# Patient Record
Sex: Male | Born: 1945 | Race: Black or African American | Hispanic: No | Marital: Single | Smoking: Former smoker
Health system: Southern US, Community
[De-identification: ages and names within clinical notes are randomized; demographics above are authoritative.]

## PROBLEM LIST (undated history)

## (undated) DIAGNOSIS — E119 Type 2 diabetes mellitus without complications: Secondary | ICD-10-CM

## (undated) DIAGNOSIS — E785 Hyperlipidemia, unspecified: Secondary | ICD-10-CM

## (undated) DIAGNOSIS — I10 Essential (primary) hypertension: Secondary | ICD-10-CM

## (undated) DIAGNOSIS — C801 Malignant (primary) neoplasm, unspecified: Secondary | ICD-10-CM

## (undated) HISTORY — PX: PROSTATECTOMY: SHX69

---

## 2001-10-31 ENCOUNTER — Emergency Department (HOSPITAL_COMMUNITY): Admission: EM | Admit: 2001-10-31 | Discharge: 2001-11-01 | Payer: Self-pay | Admitting: *Deleted

## 2010-07-01 ENCOUNTER — Ambulatory Visit: Payer: Self-pay | Admitting: Internal Medicine

## 2010-07-01 ENCOUNTER — Inpatient Hospital Stay (HOSPITAL_COMMUNITY): Admission: EM | Admit: 2010-07-01 | Discharge: 2010-07-03 | Payer: Self-pay | Admitting: Cardiology

## 2010-07-01 ENCOUNTER — Ambulatory Visit: Payer: Self-pay | Admitting: Family Medicine

## 2010-07-02 ENCOUNTER — Encounter: Payer: Self-pay | Admitting: Family Medicine

## 2010-07-03 LAB — CONVERTED CEMR LAB
Hgb A1c MFr Bld: 6.5 %
Potassium: 3.7 meq/L
Sodium: 140 meq/L

## 2010-07-20 ENCOUNTER — Ambulatory Visit: Payer: Self-pay | Admitting: Family Medicine

## 2010-07-20 ENCOUNTER — Encounter: Payer: Self-pay | Admitting: Family Medicine

## 2010-07-20 DIAGNOSIS — N183 Chronic kidney disease, stage 3 (moderate): Secondary | ICD-10-CM

## 2010-07-20 DIAGNOSIS — I10 Essential (primary) hypertension: Secondary | ICD-10-CM

## 2010-07-20 DIAGNOSIS — Z8679 Personal history of other diseases of the circulatory system: Secondary | ICD-10-CM | POA: Insufficient documentation

## 2010-07-20 DIAGNOSIS — F17200 Nicotine dependence, unspecified, uncomplicated: Secondary | ICD-10-CM

## 2010-07-20 LAB — CONVERTED CEMR LAB
CO2: 25 meq/L (ref 19–32)
Calcium: 9.8 mg/dL (ref 8.4–10.5)
Glucose, Bld: 94 mg/dL (ref 70–99)
Sodium: 138 meq/L (ref 135–145)

## 2010-07-25 ENCOUNTER — Telehealth: Payer: Self-pay | Admitting: Family Medicine

## 2010-07-26 ENCOUNTER — Encounter: Payer: Self-pay | Admitting: Family Medicine

## 2010-07-26 ENCOUNTER — Ambulatory Visit: Payer: Self-pay | Admitting: Family Medicine

## 2010-08-01 ENCOUNTER — Telehealth: Payer: Self-pay | Admitting: *Deleted

## 2010-08-21 ENCOUNTER — Ambulatory Visit: Payer: Self-pay | Admitting: Family Medicine

## 2010-09-15 ENCOUNTER — Encounter: Payer: Self-pay | Admitting: Family Medicine

## 2010-09-20 ENCOUNTER — Ambulatory Visit: Payer: Self-pay | Admitting: Family Medicine

## 2010-09-20 ENCOUNTER — Encounter: Payer: Self-pay | Admitting: Family Medicine

## 2010-09-20 ENCOUNTER — Encounter: Payer: Self-pay | Admitting: *Deleted

## 2010-09-20 DIAGNOSIS — E1129 Type 2 diabetes mellitus with other diabetic kidney complication: Secondary | ICD-10-CM

## 2010-09-20 LAB — CONVERTED CEMR LAB
CO2: 24 meq/L (ref 19–32)
Chloride: 102 meq/L (ref 96–112)
Potassium: 5.1 meq/L (ref 3.5–5.3)
Sodium: 140 meq/L (ref 135–145)

## 2010-10-13 ENCOUNTER — Telehealth: Payer: Self-pay | Admitting: *Deleted

## 2010-10-16 ENCOUNTER — Telehealth: Payer: Self-pay | Admitting: *Deleted

## 2010-10-24 ENCOUNTER — Encounter (INDEPENDENT_AMBULATORY_CARE_PROVIDER_SITE_OTHER): Payer: Self-pay | Admitting: *Deleted

## 2010-11-02 ENCOUNTER — Telehealth: Payer: Self-pay | Admitting: Family Medicine

## 2010-12-26 NOTE — Assessment & Plan Note (Signed)
Summary: h/fup,tcb   Vital Signs:  Patient profile:   65 year old male Height:      69 inches Weight:      201 pounds BMI:     29.79 Temp:     97.9 degrees F oral Pulse rate:   65 / minute BP sitting:   176 / 97  (left arm) Cuff size:   regular  Vitals Entered By: Tessie Fass CMA (July 20, 2010 1:53 PM) CC: hospital f/u Is Patient Diabetic? Yes Pain Assessment Patient in pain? no        Primary Care Provider:  Clementeen Graham, MD  CC:  hospital f/u.  History of Present Illness: Hypertension Medications: HCTZ Headache: NA Pre/Syncope:No Chest Pain, Palpitation, Dyspnea:No Home BP: 135-179/85-97  Hyperlipidemia Medications:Pravastatin 40  Current LDL: 89 Chest Pain:No  CVA symptoms: Resolving. Hand stregenth and dexterity is improving on own. Feeling much better. Does not want to have another stroke. No new symptoms or signs.   Insurance: Advice worker. Pt lives in [place] near [place]. Ms Loleta Chance referred him to her equilivent in that county. He is waiting for apporval. Discussed that he may want to transfer PCP to that system for coverage.     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 1 By Mouth Daily 2)  Pravastatin Sodium 40 Mg Tabs (Pravastatin Sodium) 3)  Aspir-Low 81 Mg Tbec (Aspirin)  Allergies (verified): No Known Drug Allergies  Past History:  Family History: Last updated: 07/20/2010  Extensive hypertension, diabetes and  hyperlipidemia   history.  His sister died of a hemorrhagic stroke in her late 34s.  Mom  died of cancer in her later years.   Past Medical History: CVA 06/2010 HTN DM A1C 6.5 06/2010 CKD stage 3 (GFR 58) 06/2010  Past Surgical History: ECHO 06/2010: LVH EF 60-65% MRI/A 06/2010: Punctate infarcts occiital lobe, no carrotid stenosis.  Posterior neck surgery.   Family History:  Extensive hypertension, diabetes and hyperlipidemia   history.  His sister died of a hemorrhagic stroke in her late 68s.  Mom  died of cancer in her later years.   Social History: Lives alone but family lives next-door.  He is retired.   He uses tobacco in the form of dip 1 can a week and does not drink  alcohol and no drugs.Smoking Status:  current  Review of Systems  The patient denies anorexia, fever, weight loss, hoarseness, chest pain, syncope, dyspnea on exertion, peripheral edema, headaches, abdominal pain, muscle weakness, difficulty walking, depression, unusual weight change, enlarged lymph nodes, and testicular masses.    Physical Exam  General:  VS noted <elevated> Well appearing obese male in NAD Head:  Normocephalic and atraumatic without obvious abnormalities. No apparent alopecia or balding. Eyes:  No corneal or conjunctival inflammation noted. EOMI. Perrla.  Vision grossly normal. Nose:  External nasal examination shows no deformity or inflammation. Nasal mucosa are pink and moist without lesions or exudates. Mouth:  Oral mucosa and oropharynx without lesions or exudates.  Chest Wall:  No deformities, masses, tenderness or gynecomastia noted. Lungs:  Normal respiratory effort, chest expands symmetrically. Lungs are clear to auscultation, no crackles or wheezes. Heart:  Normal rate and regular rhythm. S1 and S2 normal without gallop, murmur, click, rub or other extra sounds. Abdomen:  Bowel sounds positive,abdomen soft and non-tender without masses, organomegaly or hernias noted. Extremities:  Non  edemetus BL LE, and DP/PT pulses 2+ BL LE Neurologic:  No cranial nerve deficits noted. Station and gait are normal. Plantar reflexes are down-going bilaterally. DTRs are symmetrical throughout. Sensory, motor and coordinative functions appear intact. Cervical Nodes:  No lymphadenopathy noted Axillary Nodes:  No palpable lymphadenopathy Psych:  Cognition and judgment appear intact. Alert and cooperative with normal attention span and concentration. No apparent delusions, illusions, hallucinations   Impression & Recommendations:  Problem # 1:  ESSENTIAL HYPERTENSION, BENIGN (ICD-401.1) Assessment New  His updated medication list for this problem includes:    Lisinopril-hydrochlorothiazide 20-25 Mg Tabs (Lisinopril-hydrochlorothiazide) .Marland Kitchen... 1 by mouth daily Doing well at home but room for tighter control.  Plan to start HCTZ/Lisinopril 25/20 combo pill and follow pressures in 1 month.  Will assess renal fxn as below.  Orders: Basic Met-[facility] [id number])  BP today: 176/97  Labs Reviewed: K+: 3.7 (07/03/2010) Creat: : 1.48 (07/03/2010)   HDL: 45 (07/03/2010)   LDL: 89 (07/03/2010)   TG: 66 (07/03/2010)  Problem # 2:  CHRONIC KIDNEY DISEASE STAGE III (MODERATE) (ICD-585.3) Assessment: New Will assess creatine today and in 1 month with startin ACEi therapy.   Labs Reviewed: Cr: 1.48 (07/03/2010)    Hgb: 14.7 (07/03/2010)     Problem # 3:  CEREBROVASCULAR ACCIDENT, HX OF (ICD-V12.50) Assessment: New Doing well will continue risk factor modification.   Problem # 4:  TOBACCO ABUSE (ICD-305.1) Assessment: New Uses dip. No lesions noted. Will continue to follow.   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 Will follow AIC q3 months.   His updated medication list for this problem includes:    Lisinopril-hydrochlorothiazide 20-25 Mg Tabs (Lisinopril-hydrochlorothiazide) .Marland Kitchen... 1 by mouth daily    Aspir-low 81 Mg Tbec  (Aspirin)  Complete Medication List: 1)  Lisinopril-hydrochlorothiazide 20-25 Mg Tabs (Lisinopril-hydrochlorothiazide) .Marland Kitchen.. 1 by mouth daily 2)  Pravastatin Sodium 40 Mg Tabs (Pravastatin sodium) 3)  Aspir-low 81 Mg Tbec (Aspirin)  Patient Instructions: 1)  Thank you for seeing me today. 2)  Get in touch with the social worker to get set up in [place]. A doctor there may be much cheaper.  3)  I switched the BP medication to a combopill. Start taking that. Save the hydrochlorothiazide.  4)  Please schedule a follow-up appointment in 1 month.  5)  If you have chest pain, difficulty breathing, fevers over 102 that does not get better with tylenol please call us or see a doctor.  Prescriptions: LISINOPRIL-HYDROCHLOROTHIAZIDE 20-25 MG TABS (LISINOPRIL-HYDROCHLOROTHIAZIDE) 1 by mouth daily  #30 x 6   Entered and Authorized by:   Clementeen Graham MD   Signed by:   Clementeen Graham MD on 07/20/2010   Method used:   Electronically to        [facility] [id number] (retail)       [address]       [place], [place]  [address]       Ph: [phone]       Fax: [phone]   RxID:   [id number]    -  Date:  07/03/2010    NA 140    K 3.7    CREAT 1.48    GLU 140    LDL 89    HDL 45    TG 66    HGB 14.7    PLTS 208    HgBA1c 6.5    TSH 2.33    Prevention & Chronic Care Immunizations   Influenza vaccine: Not documented   Influenza vaccine deferral: Not indicated  (07/20/2010)    Tetanus booster: Not documented   Td booster deferral: Deferred  (07/20/2010)    Pneumococcal vaccine: Not documented    H. zoster vaccine: Not documented   H. zoster vaccine deferral: Deferred  (07/20/2010)  Colorectal Screening   Hemoccult: Not documented   Hemoccult action/deferral: Deferred  (07/20/2010)    Colonoscopy: Not documented   Colonoscopy action/deferral: Deferred  (07/20/2010)  Other Screening   PSA: Not documented   PSA action/deferral: Discussion deferred  (07/20/2010)   Smoking  status: current  (07/20/2010)   Smoking cessation counseling: yes  (07/20/2010)  Diabetes Mellitus   HgbA1C: 6.5  (07/03/2010)    Eye exam: Not documented    Foot exam: Not documented   High risk foot: Not documented   Foot care education: Not documented    Urine microalbumin/creatinine ratio: Not documented    Diabetes flowsheet reviewed?: Yes   Progress toward A1C goal: At goal  Lipids   Total Cholesterol: Not documented   Lipid panel action/deferral: Not indicated   LDL: 89  (07/03/2010)   LDL Direct: Not documented   HDL: 45  (07/03/2010)   Triglycerides: Not documented  Hypertension   Last Blood Pressure: 176 / 97  (07/20/2010)   Serum creatinine: 1.48  (07/03/2010)   Serum potassium 3.7  (07/03/2010)    Hypertension flowsheet reviewed?: Yes   Progress toward BP goal: Deteriorated  Self-Management Support :   Personal Goals (by the next clinic visit) :     Personal A1C goal: 7  (07/20/2010)     Personal blood pressure goal: 140/90  (07/20/2010)     Personal LDL goal: 100  (07/20/2010)    Patient will work on the following items until the next clinic visit to reach self-care goals:     Medications and monitoring: take my medicines every day, check my blood pressure, bring all of my medications to every visit  (07/20/2010)    Diabetes self-management support: Not documented    Hypertension self-management support: Not documented

## 2010-12-26 NOTE — Miscellaneous (Signed)
Summary: medical record request    Rec'd medical record request to go to: Disability determination services date sent: 09/29/10 Marily Memos  October 24, 2010 11:06 AM

## 2010-12-26 NOTE — Miscellaneous (Signed)
Summary: Jury Duty Form  Patient dropped off Jury Duty form to be filled out.  Please fax when completed. Bradly Bienenstock  September 15, 2010 11:29 AM  [employer] M[employer] Duty form placed in Dr. Zollie Pee box. Terese Door  September 15, 2010 11:32 AM

## 2010-12-26 NOTE — Assessment & Plan Note (Signed)
Summary: F/U VISIT/[facility]   Vital Signs:  Patient profile:   65 year old male Height:      69 inches Weight:      194 pounds BMI:     28.75 Temp:     98.3 degrees F oral Pulse rate:   90 / minute BP sitting:   140 / 80  (left arm) Cuff size:   regular  Vitals Entered By: Jimmy Footman, CMA (September 20, 2010 1:47 PM) CC: BP f/u Is Patient Diabetic? Yes Did you bring your meter with you today? No Pain Assessment Patient in pain? no        Primary Care Provider:  Clementeen Graham, MD  CC:  BP f/u.  History of Present Illness: Mr Peter Levine presents to clinic to follow up his HTN, CKD and CVA:  Hypertension Medications: HCTZ, Amlodipine Headache: No Pre/Syncope:No Chest Pain, Palpitation, Dyspnea:No Home BP: Not measuring  CKD: Stable. Has stoped taking lisinopril as directed. Additionally is taking an Aspirin daily. No edema, or dyspnea. Has managed to get the [facility] (community access) card. Does not understand what CKD is. But does know what dialysis is and dose not want to get there.   CVA: Strength is improving in his right hand as well as dexterity.  Hand is feeling alsmost back to normal. Speech is also nearing normal. Only notes some lack of fine control with writing.  Does not think that OT will help much.   HLD: Taking statin as directed. Doing well. See above.    Habits & Providers  Alcohol-Tobacco-Diet     Alcohol drinks/day: 0     Tobacco Status: current     Tobacco Counseling: to quit use of tobacco products     Other Tobacco snuff     Other per week 1  Exercise-Depression-Behavior     Have you felt down or hopeless? no     Have you felt little pleasure in things? no     Depression Counseling: not indicated; screening negative for depression  Current Problems (verified): 1)  Tobacco Abuse  (ICD-305.1) 2)  Chronic Kidney Disease Stage Iii (MODERATE)  (ICD-585.3) 3)  Diab w/o Mention Comp Type Ii/uns Type Uncntrl  (ICD-250.02) 4)  Cerebrovascular Accident, Hx  of  (ICD-V12.50) 5)  Essential Hypertension, Benign  (ICD-401.1)  Current Medications (verified): 1)  Hydrochlorothiazide 25 Mg Tabs (Hydrochlorothiazide) .Marland Kitchen.. 1 By Mouth Daily 2)  Pravastatin Sodium 40 Mg Tabs (Pravastatin Sodium) 3)  Aspir-Low 81 Mg Tbec (Aspirin) 4)  Amlodipine Besylate 10 Mg Tabs (Amlodipine Besylate) .Marland Kitchen.. 1 By Mouth Daily  Allergies (verified): No Known Drug Allergies  Past History:  Past Medical History: Last updated: 07/20/2010 CVA 06/2010 HTN DM A1C 6.5 06/2010 CKD stage 3 (GFR 58) 06/2010  Past Surgical History: Last updated: 07/20/2010 ECHO 06/2010: LVH EF 60-65% MRI/A 06/2010: Punctate infarcts occiital lobe, no carrotid stenosis.  Posterior neck surgery.   Family History: Last updated: 07/20/2010  Extensive hypertension, diabetes and hyperlipidemia   history.  His sister died of a hemorrhagic stroke in her late 76s.  Mom  died of cancer in her later years.   Social History: Last updated: 07/20/2010 Lives alone but family lives next-door.  He is retired.   He uses tobacco in the form of dip 1 can a week and does not drink  alcohol and no drugs.  Risk Factors: Smoking Status: current (09/20/2010)  Social History: Reviewed history from 07/20/2010 and no changes required. Lives alone but family lives next-door.  He is retired.  He uses tobacco in the form of dip 1 can a week and does not drink  alcohol and no drugs.  Review of Systems  The patient denies anorexia, fever, weight loss, hoarseness, chest pain, syncope, dyspnea on exertion, prolonged cough, headaches, abdominal pain, melena, severe indigestion/heartburn, muscle weakness, transient blindness, difficulty walking, abnormal bleeding, and enlarged lymph nodes.    Physical Exam  General:  VS noted.  Well male in NAD Head:  Normocephalic and atraumatic without obvious abnormalities. No apparent alopecia or balding. Eyes:  No corneal or conjunctival inflammation noted. EOMI. Perrla.   Vision grossly normal. Ears:  no external deformities.   Nose:  no external deformity.   Mouth:  Oral mucosa and oropharynx without lesions or exudates.  Neck:   No JVD noted. Lungs:  Normal respiratory effort, chest expands symmetrically. Lungs are clear to auscultation, no crackles or wheezes. Heart:  Normal rate and regular rhythm. S1 and S2 normal without gallop, murmur, click, rub or other extra sounds. Abdomen:  Bowel sounds positive,abdomen soft and non-tender without masses, organomegaly or hernias noted. Extremities:  Non edemetus BL LE. Well perfused.  Neurologic:  alert & oriented X3, cranial nerves II-XII intact, strength normal in all extremities, sensation intact to light touch, and gait normal.   Cervical Nodes:  No lymphadenopathy noted Axillary Nodes:  No palpable lymphadenopathy   Impression & Recommendations:  Problem # 1:  ESSENTIAL HYPERTENSION, BENIGN (ICD-401.1) Assessment Improved BP at goal. Idealy would like 130s/70s.  Pt may need 3rd antihypertensive.  May consider restarting a low dose ACEi. See below.  Will follow.  His updated medication list for this problem includes:    Hydrochlorothiazide 25 Mg Tabs (Hydrochlorothiazide) .Marland Kitchen... 1 by mouth daily    Amlodipine Besylate 10 Mg Tabs (Amlodipine besylate) .Marland Kitchen... 1 by mouth daily  Orders: Nephrology Referral (Nephro) [facility]- Est  Level 4 ([address]214)  BP today: 140/80 Prior BP: 160/82 (08/21/2010)  Labs Reviewed: K+: 5.3 (07/26/2010) Creat: : 2.11 (07/26/2010)   HDL: 45 (07/03/2010)   LDL: 89 (07/03/2010)   TG: 66 (07/03/2010)  Problem # 2:  CHRONIC KIDNEY DISEASE STAGE III (MODERATE) (ICD-585.3) Assessment: Unchanged CKD III.  BP controlled. On statin and aspirin.  Will refer patient to nephrology as has stage III CKD and HTN.  Will follow.   Orders: Basic Met-[facility] ([id number]) Nephrology Referral (Nephro) [facility]- Est  Level 4 (56213)  Problem # 3:  TOBACCO ABUSE (ICD-305.1) Assessment: Unchanged  On 1  can of dip per week. Not ready to quit yet.  Discussed ways to quit.  Orders: [facility]- Est  Level 4 (08657)  Problem # 4:  DIAB W/RENAL MANIFESTS TYPE II/UNS NOT UNCNTRL (ICD-250.40) Assessment: Unchanged  A1C at goal. Will follow.  Alpha Gula will get patient to diabetes education.  His updated medication list for this problem includes:    Aspir-low 81 Mg Tbec (Aspirin)  Labs Reviewed: Creat: 2.11 (07/26/2010)    Reviewed HgBA1c results: 6.5 (07/03/2010)  Orders: [facility]- Est  Level 4 (84696)  Problem # 5:  CEREBROVASCULAR ACCIDENT, HX OF (ICD-V12.50) Assessment: Improved Doing well today. Recovering well.  Will continue ASA and follow.   Complete Medication List: 1)  Hydrochlorothiazide 25 Mg Tabs (Hydrochlorothiazide) .Marland Kitchen.. 1 by mouth daily 2)  Pravastatin Sodium 40 Mg Tabs (Pravastatin sodium) 3)  Aspir-low 81 Mg Tbec (Aspirin) 4)  Amlodipine Besylate 10 Mg Tabs (Amlodipine besylate) .Marland Kitchen.. 1 by mouth daily  Other Orders: Influenza Vaccine NON MCR (29528)  Patient Instructions: 1)  Thank you for seeing  me today. 2)  Please go see the kidney doctor. 3)  Come back in 3-6 months.  Prescriptions: AMLODIPINE BESYLATE 10 MG TABS (AMLODIPINE BESYLATE) 1 by mouth daily  #30 x 6   Entered and Authorized by:   Clementeen Graham MD   Signed by:   Clementeen Graham MD on 09/20/2010   Method used:   Print then Give to Patient   RxID:   [id number]    Orders Added: 1)  Basic Met-[facility] [[id number]] 2)  Nephrology Referral [Nephro] 3)  Influenza Vaccine NON MCR [[id number]] 4)  [facility]- Est  Level 4 [41324]   Immunizations Administered:  Influenza Vaccine # 1:    Vaccine Type: Fluvax Non-MCR    Site: left deltoid    Mfr: GlaxoSmithKline    Dose: 0.5 ml    Route: IM    Given by: Starleen Blue RN    Exp. Date: 05/23/2011    Lot #: [id number]    VIS given: 06/20/10 version given September 20, 2010.  Flu Vaccine Consent Questions:    Do you have a history of severe allergic reactions to this vaccine?  no    Any prior history of allergic reactions to egg and/or gelatin? no    Do you have a sensitivity to the preservative Thimersol? no    Do you have a past history of Guillan-Barre Syndrome? no    Do you currently have an acute febrile illness? no    Have you ever had a severe reaction to latex? no    Vaccine information given and explained to patient? yes   Immunizations Administered:  Influenza Vaccine # 1:    Vaccine Type: Fluvax Non-MCR    Site: left deltoid    Mfr: GlaxoSmithKline    Dose: 0.5 ml    Route: IM    Given by: Starleen Blue RN    Exp. Date: 05/23/2011    Lot #: [id number]    VIS given: 06/20/10 version given September 20, 2010.   Prevention & Chronic Care Immunizations   Influenza vaccine: Fluvax Non-MCR  (09/20/2010)   Influenza vaccine deferral: Not available  (08/21/2010)    Tetanus booster: Not documented   Td booster deferral: Deferred  (07/20/2010)    Pneumococcal vaccine: Not documented    H. zoster vaccine: Not documented   H. zoster vaccine deferral: Deferred  (07/20/2010)  Colorectal Screening   Hemoccult: Not documented   Hemoccult action/deferral: Deferred  (07/20/2010)    Colonoscopy: Not documented   Colonoscopy action/deferral: Deferred  (07/20/2010)  Other Screening   PSA: Not documented   PSA action/deferral: Discussed-PSA declined  (09/20/2010)   Smoking status: current  (09/20/2010)   Smoking cessation counseling: yes  (07/20/2010)  Diabetes Mellitus   HgbA1C: 6.5  (07/03/2010)    Eye exam: Not documented    Foot exam: Not documented   High risk foot: Not documented   Foot care education: Not documented    Urine microalbumin/creatinine ratio: Not documented    Diabetes flowsheet reviewed?: Yes   Progress toward A1C goal: At goal  Lipids   Total Cholesterol: Not documented   Lipid panel action/deferral: Not indicated   LDL: 89  (07/03/2010)   LDL Direct: Not documented   HDL: 45  (07/03/2010)   Triglycerides: Not  documented  Hypertension   Last Blood Pressure: 140 / 80  (09/20/2010)   Serum creatinine: 2.11  (07/26/2010)   Serum potassium 5.3  (07/26/2010)    Hypertension flowsheet reviewed?: Yes   Progress toward BP goal: At  goal  Self-Management Support :   Personal Goals (by the next clinic visit) :     Personal A1C goal: 8  (08/21/2010)     Personal blood pressure goal: 140/90  (07/20/2010)     Personal LDL goal: 100  (07/20/2010)    Diabetes self-management support: Not documented    Hypertension self-management support: Not documented   Nursing Instructions: Give Flu vaccine today

## 2010-12-26 NOTE — Letter (Signed)
Summary: Generic Letter  [facility]  [address]   [place], [place] [address]   Phone: [phone]  Fax: [phone]    09/20/2010 MRN: [id number]  [address] [place], [place]  [address]  Dear Mr. POCOCK,  I was unable to reach you by phone so I am sending a letter to let you know that Dr. Denyse Amass would like for you to see a Kidney doctor.  I would like to set you up with a docter at [facility].  Please call out office so we can discuss this with you.           Sincerely,   Dennison Nancy RN [facility]

## 2010-12-26 NOTE — Progress Notes (Signed)
  Phone Note Outgoing Call   Call placed to: Patient Summary of Call: Spoke with patient to ask if he was excused from jury duty. Pt states that he was and that it is ok to schedule the referral to nephrology @ [facility] Initial call taken by: Jimmy Footman, CMA,  October 13, 2010 9:09 AM

## 2010-12-26 NOTE — Progress Notes (Signed)
----   Converted from flag ---- ---- 08/01/2010 2:59 PM, Clementeen Graham MD wrote: Please call and schedule an appt with Mr Rhett for me in the next 1-3 weeks if possible. I am off site and cannot use the phones in [place] office.   Thanks, Evan ------------------------------  attemted to call pt no answer, no voicemail. pt has appt with Denyse Amass 08-21-10 @ 1:45.[facility] CMA  August 02, 2010 10:08 AM

## 2010-12-26 NOTE — Assessment & Plan Note (Signed)
Summary: F/U/KH   Vital Signs:  Patient profile:   65 year old male Height:      69 inches Weight:      199 pounds BMI:     29.49 BSA:     2.06 Temp:     97.7 degrees F Pulse rate:   73 / minute BP sitting:   160 / 82  Vitals Entered By: Jone Baseman CMA (August 21, 2010 1:50 PM) CC: f/u Is Patient Diabetic? No Pain Assessment Patient in pain? no        Primary Care Provider:  Clementeen Graham, MD  CC:  f/u.  History of Present Illness: Mr Weisenburger presents to clinic today to follow up his BP, and CVA.  Hypertension/HLD Medications: HCTZ,  Stoped lisinopril as directed due to elevated creatine.  Continues pravastatin daily. Headache: No Pre/Syncope:No Chest Pain, Palpitation, Dyspnea:No Home BP: Not taking.   CVA: Right hand coordination and stregnth is returning to normal.  Sensation is also returning to normal but at a slower pace.  Able to write with right hand and feed self.  No other signs or symptoms of CVA since hospital D/C.  Also taking ASA 81 mg.   Social: Lives in [place].   Has not yet contacted [facility] Cobb get the orange community access card.  Has also not contacted his county's DSS.    Habits & Providers  Alcohol-Tobacco-Diet     Alcohol drinks/day: 0     Tobacco Status: current  Current Problems (verified): 1)  Tobacco Abuse  (ICD-305.1) 2)  Chronic Kidney Disease Stage Iii (MODERATE)  (ICD-585.3) 3)  Diab w/o Mention Comp Type Ii/uns Type Uncntrl  (ICD-250.02) 4)  Cerebrovascular Accident, Hx of  (ICD-V12.50) 5)  Essential Hypertension, Benign  (ICD-401.1)  Current Medications (verified): 1)  Hydrochlorothiazide 25 Mg Tabs (Hydrochlorothiazide) .Marland Kitchen.. 1 By Mouth Daily 2)  Pravastatin Sodium 40 Mg Tabs (Pravastatin Sodium) 3)  Aspir-Low 81 Mg Tbec (Aspirin) 4)  Amlodipine Besylate 10 Mg Tabs (Amlodipine Besylate) .Marland Kitchen.. 1 By Mouth Daily  Allergies (verified): No Known Drug Allergies  Past History:  Past Medical History: Last  updated: 07/20/2010 CVA 06/2010 HTN DM A1C 6.5 06/2010 CKD stage 3 (GFR 58) 06/2010  Past Surgical History: Last updated: 07/20/2010 ECHO 06/2010: LVH EF 60-65% MRI/A 06/2010: Punctate infarcts occiital lobe, no carrotid stenosis.  Posterior neck surgery.   Family History: Last updated: 07/20/2010  Extensive hypertension, diabetes and hyperlipidemia   history.  His sister died of a hemorrhagic stroke in her late 14s.  Mom  died of cancer in her later years.   Social History: Last updated: 07/20/2010 Lives alone but family lives next-door.  He is retired.   He uses tobacco in the form of dip 1 can a week and does not drink  alcohol and no drugs.  Risk Factors: Smoking Status: current (08/21/2010)  Review of Systems  The patient denies anorexia, weight loss, chest pain, headaches, abdominal pain, severe indigestion/heartburn, transient blindness, difficulty walking, and enlarged lymph nodes.    Physical Exam  General:  VS noted and rechecked.  Well male in NAD Eyes:  No corneal or conjunctival inflammation noted. EOMI. Perrla.  Vision grossly normal. Mouth:  Oral mucosa and oropharynx without lesions or exudates.  Lungs:  Normal respiratory effort, chest expands symmetrically. Lungs are clear to auscultation, no crackles or wheezes. Heart:  Normal rate and regular rhythm. S1 and S2 normal without gallop, murmur, click, rub or other extra sounds. Abdomen:  Bowel sounds positive,abdomen soft  and non-tender without masses, organomegaly or hernias noted. Extremities:  Non edemetus BL LE, and DP/PT pulses 2+ BL LE Cervical Nodes:  No lymphadenopathy noted Axillary Nodes:  No palpable lymphadenopathy   Impression & Recommendations:  Problem # 1:  ESSENTIAL HYPERTENSION, BENIGN (ICD-401.1) Assessment Deteriorated  Blood Pressure continues to be elevated with HCTZ alone.  Unable to tolerate ACEi due to renal disease.  Plan at this time to start Norvasc 10 mg daily and follow up BP  and BMP in 1 month.  His updated medication list for this problem includes:    Hydrochlorothiazide 25 Mg Tabs (Hydrochlorothiazide) .Marland Kitchen... 1 by mouth daily    Amlodipine Besylate 10 Mg Tabs (Amlodipine besylate) .Marland Kitchen... 1 by mouth daily  Orders: [facility]- Est  Level 4 (99214)  Problem # 2:  CHRONIC KIDNEY DISEASE STAGE III (MODERATE) (ICD-585.3) Assessment: Unchanged  Continues to be worrysem.  Pt working on getting orange card from [employer].  Will require community access to see nephrologists.  Will nee nephrology involved due to stage III CKD.  This disease will likely worsen over time and pt may require dialysis in the future.  Discussed this prognosis with the patient.    Orders: [facility]- Est  Level 4 (72536)  Problem # 3:  CEREBROVASCULAR ACCIDENT, HX OF (ICD-V12.50) Assessment: Improved Doing well.  Improving.    Cannot currrently afford OT for hand rehab.  HOwever functionality is improving. Plan to referr to OT when orange card obtained.  On pravastatin and ASA  Problem # 4:  TOBACCO ABUSE (ICD-305.1) Assessment: Unchanged Pre-contiplative.  Problem # 5:  DIAB W/O MENTION COMP TYPE II/UNS TYPE UNCNTRL (ICD-250.02) Assessment: Unchanged  Diet controlled at this time.   Will follow HbA1c every 3 months.  His updated medication list for this problem includes:    Aspir-low 81 Mg Tbec (Aspirin)  Labs Reviewed: Creat: 2.11 (07/26/2010)    Reviewed HgBA1c results: 6.5 (07/03/2010)  Orders: [facility]- Est  Level 4 (64403)  Complete Medication List: 1)  Hydrochlorothiazide 25 Mg Tabs (Hydrochlorothiazide) .Marland Kitchen.. 1 by mouth daily 2)  Pravastatin Sodium 40 Mg Tabs (Pravastatin sodium) 3)  Aspir-low 81 Mg Tbec (Aspirin) 4)  Amlodipine Besylate 10 Mg Tabs (Amlodipine besylate) .Marland Kitchen.. 1 by mouth daily  Patient Instructions: 1)  Thank you for seeing me today. 2)  Please call Rudell Cobb at [phone] to get your card to see doctors.  3)  Take the HCTZ, Amlopidine, Pravastatin, and Aspirin  daily. 4)  Come back in 1 month. 5)  If you have chest pain, difficulty breathing, fevers over 102 that does not get better with tylenol please call us or see a doctor.  Prescriptions: AMLODIPINE BESYLATE 10 MG TABS (AMLODIPINE BESYLATE) 1 by mouth daily  #30 x 6   Entered and Authorized by:   Clementeen Graham MD   Signed by:   Clementeen Graham MD on 08/21/2010   Method used:   Electronically to        [facility] [id number] (retail)       [address]       [place], [place]  [address]       Ph: [phone]       Fax: [phone]   RxID:   [id number] HYDROCHLOROTHIAZIDE 25 MG TABS (HYDROCHLOROTHIAZIDE) 1 by mouth daily  #30 x 6   Entered and Authorized by:   Clementeen Graham MD   Signed by:   Clementeen Graham MD on 08/21/2010   Method used:  Electronically to        [facility] [id number] (retail)       [phone]       [place], [place]  [address]       Ph: [phone]       Fax: [phone]   RxID:   [id number]    Prevention & Chronic Care Immunizations   Influenza vaccine: Not documented   Influenza vaccine deferral: Not available  (08/21/2010)    Tetanus booster: Not documented   Td booster deferral: Deferred  (07/20/2010)    Pneumococcal vaccine: Not documented    H. zoster vaccine: Not documented   H. zoster vaccine deferral: Deferred  (07/20/2010)  Colorectal Screening   Hemoccult: Not documented   Hemoccult action/deferral: Deferred  (07/20/2010)    Colonoscopy: Not documented   Colonoscopy action/deferral: Deferred  (07/20/2010)  Other Screening   PSA: Not documented   PSA action/deferral: Discussion deferred  (07/20/2010)   Smoking status: current  (08/21/2010)   Smoking cessation counseling: yes  (07/20/2010)  Diabetes Mellitus   HgbA1C: 6.5  (07/03/2010)    Eye exam: Not documented    Foot exam: Not documented   High risk foot: Not documented   Foot care education: Not documented    Urine microalbumin/creatinine ratio: Not documented    Diabetes  flowsheet reviewed?: Yes   Progress toward A1C goal: At goal  Lipids   Total Cholesterol: Not documented   Lipid panel action/deferral: Not indicated   LDL: 89  (07/03/2010)   LDL Direct: Not documented   HDL: 45  (07/03/2010)   Triglycerides: Not documented  Hypertension   Last Blood Pressure: 160 / 82  (08/21/2010)   Serum creatinine: 2.11  (07/26/2010)   Serum potassium 5.3  (07/26/2010)    Hypertension flowsheet reviewed?: Yes   Progress toward BP goal: Improved  Self-Management Support :   Personal Goals (by the next clinic visit) :     Personal A1C goal: 8  (08/21/2010)     Personal blood pressure goal: 140/90  (07/20/2010)     Personal LDL goal: 100  (07/20/2010)    Patient will work on the following items until the next clinic visit to reach self-care goals:     Medications and monitoring: take my medicines every day, check my blood pressure, bring all of my medications to every visit  (08/21/2010)    Diabetes self-management support: Not documented    Hypertension self-management support: Not documented

## 2010-12-26 NOTE — Miscellaneous (Signed)
Summary: Medical record request  Clinical Lists Changes  Received medical record request to go to: Disability determination services date sent: 09/15/10 Marily Memos  October 24, 2010 1:57 PM

## 2010-12-26 NOTE — Progress Notes (Signed)
  Phone Note Call from Patient   Caller: Patient Summary of Call: pt checking on request for refill.  Order in box.  Informed pt that physician will get to it and fax back to pharmacy Initial call taken by: Abundio Miu,  November 02, 2010 3:00 PM  Follow-up for Phone Call        Rx sent electronically. Cannot leave a message on pt's phone.

## 2010-12-26 NOTE — Progress Notes (Signed)
Summary: phn msg  Phone Note Call from Patient Call back at Home Phone [phone]   Caller: Patient Summary of Call: pt is asking about referral Initial call taken by: De Nurse,  October 16, 2010 3:41 PM  Follow-up for Phone Call        spoke with pt and his is going to call [place] Follow-up by: Jimmy Footman, CMA,  October 17, 2010 9:17 AM

## 2010-12-26 NOTE — Progress Notes (Signed)
  Phone Note Outgoing Call   Caller: Patient Call placed by: Clementeen Graham MD,  July 25, 2010 3:59 PM Summary of Call: Called to inform pt about creatine and ask to stop taking combo pill and start back on HCTZ.  Also to come back for lab draw this week. Initial call taken by: Clementeen Graham MD,  July 25, 2010 4:00 PM     Appended Document:     Clinical Lists Changes  Orders: Added new Test order of [facility] [phone]) - Signed

## 2010-12-26 NOTE — Progress Notes (Signed)
  Phone Note Outgoing Call   Call placed by: Jimmy Footman, CMA,  October 13, 2010 2:19 PM Call placed to: Patient Summary of Call: Spoke with patient and informed him per [facility] that he is to call them Monday to set up appt with their Nephrology dept. [phone]) [phone]. I am faxing records and referral to [facility] today Initial call taken by: Jimmy Footman, CMA,  October 13, 2010 2:20 PM

## 2011-02-09 LAB — BASIC METABOLIC PANEL
BUN: 6 mg/dL (ref 6–23)
BUN: 7 mg/dL (ref 6–23)
CO2: 26 mEq/L (ref 19–32)
Chloride: 103 mEq/L (ref 96–112)
Chloride: 105 mEq/L (ref 96–112)
Creatinine, Ser: 1.38 mg/dL (ref 0.4–1.5)
Creatinine, Ser: 1.48 mg/dL (ref 0.4–1.5)
GFR calc Af Amer: >60 mL/min (ref >60)
GFR calc non Af Amer: 48 mL/min — ABNORMAL LOW (ref >60)
GFR calc non Af Amer: 52 mL/min — ABNORMAL LOW (ref >60)
Glucose, Bld: 129 mg/dL — ABNORMAL HIGH (ref 70–99)
Potassium: 3.3 mEq/L — ABNORMAL LOW (ref 3.5–5.1)
Sodium: 143 mEq/L (ref 135–145)

## 2011-02-09 LAB — GLUCOSE, CAPILLARY
Glucose-Capillary: 136 mg/dL — ABNORMAL HIGH (ref 70–99)
Glucose-Capillary: 144 mg/dL — ABNORMAL HIGH (ref 70–99)

## 2011-02-09 LAB — CARDIAC PANEL(CRET KIN+CKTOT+MB+TROPI)
CK, MB: 1.8 ng/mL (ref 0.3–4.0)
Total CK: 418 U/L — ABNORMAL HIGH (ref 7–232)
Troponin I: 0.02 ng/mL (ref 0.00–0.06)

## 2011-02-09 LAB — PROTIME-INR
INR: 0.99 (ref 0.00–1.49)
Prothrombin Time: 13.3 seconds (ref 11.6–15.2)

## 2011-02-09 LAB — CK TOTAL AND CKMB (NOT AT ARMC)
Relative Index: 0.4 (ref 0.0–2.5)
Total CK: 466 U/L — ABNORMAL HIGH (ref 7–232)

## 2011-02-09 LAB — POCT I-STAT, CHEM 8
Calcium, Ion: 1.12 mmol/L (ref 1.12–1.32)
Creatinine, Ser: 1.7 mg/dL — ABNORMAL HIGH (ref 0.4–1.5)
Potassium: 2.6 mEq/L — CL (ref 3.5–5.1)
Sodium: 140 mEq/L (ref 135–145)
TCO2: 29 mmol/L (ref 0–100)

## 2011-02-09 LAB — APTT: aPTT: 31 seconds (ref 24–37)

## 2011-02-09 LAB — COMPREHENSIVE METABOLIC PANEL WITH GFR
ALT: 14 U/L (ref 0–53)
AST: 29 U/L (ref 0–37)
Albumin: 3.8 g/dL (ref 3.5–5.2)
Alkaline Phosphatase: 73 U/L (ref 39–117)
BUN: 11 mg/dL (ref 6–23)
CO2: 28 meq/L (ref 19–32)
Calcium: 9.1 mg/dL (ref 8.4–10.5)
Chloride: 101 meq/L (ref 96–112)
Creatinine, Ser: 1.63 mg/dL — ABNORMAL HIGH (ref 0.4–1.5)
GFR calc non Af Amer: 43 mL/min — ABNORMAL LOW
Glucose, Bld: 189 mg/dL — ABNORMAL HIGH (ref 70–99)
Potassium: 2.7 meq/L — CL (ref 3.5–5.1)
Sodium: 138 meq/L (ref 135–145)
Total Bilirubin: 0.9 mg/dL (ref 0.3–1.2)
Total Protein: 7 g/dL (ref 6.0–8.3)

## 2011-02-09 LAB — CBC
HCT: 47.2 % (ref 39.0–52.0)
Hemoglobin: 14.7 g/dL (ref 13.0–17.0)
Hemoglobin: 16.3 g/dL (ref 13.0–17.0)
MCH: 26.9 pg (ref 26.0–34.0)
MCH: 27.9 pg (ref 26.0–34.0)
MCHC: 33 g/dL (ref 30.0–36.0)
MCHC: 34.5 g/dL (ref 30.0–36.0)
MCV: 80.7 fL (ref 78.0–100.0)
MCV: 81.5 fL (ref 78.0–100.0)
Platelets: 208 10*3/uL (ref 150–400)
RBC: 5.85 MIL/uL — ABNORMAL HIGH (ref 4.22–5.81)
RDW: 13 % (ref 11.5–15.5)
WBC: 7 10*3/uL (ref 4.0–10.5)

## 2011-02-09 LAB — LIPID PANEL
Total CHOL/HDL Ratio: 3.3 RATIO
VLDL: 13 mg/dL (ref 0–40)

## 2011-02-09 LAB — HEMOGLOBIN A1C: Mean Plasma Glucose: 140 mg/dL — ABNORMAL HIGH (ref <117)

## 2011-02-09 LAB — RAPID URINE DRUG SCREEN, HOSP PERFORMED
Amphetamines: NOT DETECTED
Cocaine: NOT DETECTED
Tetrahydrocannabinol: NOT DETECTED

## 2011-02-09 LAB — TSH: TSH: 2.338 u[IU]/mL (ref 0.350–4.500)

## 2011-02-09 LAB — DIFFERENTIAL
Basophils Relative: 0 % (ref 0–1)
Eosinophils Relative: 2 % (ref 0–5)
Lymphs Abs: 2.5 10*3/uL (ref 0.7–4.0)
Monocytes Absolute: 0.4 10*3/uL (ref 0.1–1.0)

## 2017-08-29 ENCOUNTER — Encounter (HOSPITAL_COMMUNITY): Payer: Self-pay

## 2017-08-29 ENCOUNTER — Emergency Department (HOSPITAL_COMMUNITY)
Admission: EM | Admit: 2017-08-29 | Discharge: 2017-08-29 | Disposition: A | Discharge status: Home / Self Care | Payer: No Typology Code available for payment source | Attending: Emergency Medicine | Admitting: Emergency Medicine

## 2017-08-29 ENCOUNTER — Emergency Department (HOSPITAL_COMMUNITY): Payer: No Typology Code available for payment source

## 2017-08-29 DIAGNOSIS — S161XXA Strain of muscle, fascia and tendon at neck level, initial encounter: Secondary | ICD-10-CM | POA: Insufficient documentation

## 2017-08-29 DIAGNOSIS — I10 Essential (primary) hypertension: Secondary | ICD-10-CM | POA: Insufficient documentation

## 2017-08-29 DIAGNOSIS — Y929 Unspecified place or not applicable: Secondary | ICD-10-CM | POA: Insufficient documentation

## 2017-08-29 DIAGNOSIS — S199XXA Unspecified injury of neck, initial encounter: Secondary | ICD-10-CM | POA: Diagnosis present

## 2017-08-29 DIAGNOSIS — Z8546 Personal history of malignant neoplasm of prostate: Secondary | ICD-10-CM | POA: Diagnosis not present

## 2017-08-29 DIAGNOSIS — Y939 Activity, unspecified: Secondary | ICD-10-CM | POA: Insufficient documentation

## 2017-08-29 DIAGNOSIS — Z79899 Other long term (current) drug therapy: Secondary | ICD-10-CM | POA: Diagnosis not present

## 2017-08-29 DIAGNOSIS — S8002XA Contusion of left knee, initial encounter: Secondary | ICD-10-CM | POA: Diagnosis not present

## 2017-08-29 DIAGNOSIS — Y999 Unspecified external cause status: Secondary | ICD-10-CM | POA: Insufficient documentation

## 2017-08-29 DIAGNOSIS — S20219A Contusion of unspecified front wall of thorax, initial encounter: Secondary | ICD-10-CM | POA: Diagnosis not present

## 2017-08-29 DIAGNOSIS — Z87891 Personal history of nicotine dependence: Secondary | ICD-10-CM | POA: Insufficient documentation

## 2017-08-29 DIAGNOSIS — E119 Type 2 diabetes mellitus without complications: Secondary | ICD-10-CM | POA: Diagnosis not present

## 2017-08-29 HISTORY — DX: Hyperlipidemia, unspecified: E78.5

## 2017-08-29 HISTORY — DX: Type 2 diabetes mellitus without complications: E11.9

## 2017-08-29 HISTORY — DX: Essential (primary) hypertension: I10

## 2017-08-29 HISTORY — DX: Malignant (primary) neoplasm, unspecified: C80.1

## 2017-08-29 MED ORDER — METHOCARBAMOL 500 MG PO TABS: 500.0000 mg | ORAL_TABLET | Freq: Four times a day (QID) | ORAL | 0 refills | Status: AC

## 2017-08-29 NOTE — ED Notes (Signed)
Patient transported to x-ray. ?
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
## 2017-08-29 NOTE — ED Notes (Signed)
Radiology came to pick patient up.  Not in room yet.  Went to lobby to collect.

## 2017-08-29 NOTE — ED Notes (Signed)
Patient's family member at bedside. 

## 2017-08-29 NOTE — ED Notes (Signed)
Registration continues to be at bedside

## 2017-08-29 NOTE — ED Notes (Signed)
Patient able to ambulate independently  
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
## 2019-10-08 ENCOUNTER — Emergency Department (HOSPITAL_COMMUNITY): Payer: Medicare Other

## 2019-10-08 ENCOUNTER — Observation Stay (HOSPITAL_COMMUNITY)
Admission: EM | Admit: 2019-10-08 | Discharge: 2019-10-09 | Disposition: A | Discharge status: Home / Self Care | Payer: Medicare Other | Attending: Internal Medicine | Admitting: Internal Medicine

## 2019-10-08 ENCOUNTER — Encounter (HOSPITAL_COMMUNITY): Payer: Self-pay

## 2019-10-08 ENCOUNTER — Other Ambulatory Visit: Payer: Self-pay

## 2019-10-08 DIAGNOSIS — E1165 Type 2 diabetes mellitus with hyperglycemia: Secondary | ICD-10-CM | POA: Diagnosis not present

## 2019-10-08 DIAGNOSIS — Z8673 Personal history of transient ischemic attack (TIA), and cerebral infarction without residual deficits: Secondary | ICD-10-CM | POA: Insufficient documentation

## 2019-10-08 DIAGNOSIS — Z8546 Personal history of malignant neoplasm of prostate: Secondary | ICD-10-CM | POA: Diagnosis not present

## 2019-10-08 DIAGNOSIS — R072 Precordial pain: Secondary | ICD-10-CM | POA: Diagnosis present

## 2019-10-08 DIAGNOSIS — N183 Chronic kidney disease, stage 3 unspecified: Secondary | ICD-10-CM | POA: Diagnosis not present

## 2019-10-08 DIAGNOSIS — R9431 Abnormal electrocardiogram [ECG] [EKG]: Secondary | ICD-10-CM | POA: Insufficient documentation

## 2019-10-08 DIAGNOSIS — Z7984 Long term (current) use of oral hypoglycemic drugs: Secondary | ICD-10-CM | POA: Insufficient documentation

## 2019-10-08 DIAGNOSIS — Z79899 Other long term (current) drug therapy: Secondary | ICD-10-CM | POA: Insufficient documentation

## 2019-10-08 DIAGNOSIS — R079 Chest pain, unspecified: Secondary | ICD-10-CM | POA: Diagnosis not present

## 2019-10-08 DIAGNOSIS — Z8249 Family history of ischemic heart disease and other diseases of the circulatory system: Secondary | ICD-10-CM | POA: Diagnosis not present

## 2019-10-08 DIAGNOSIS — E1122 Type 2 diabetes mellitus with diabetic chronic kidney disease: Secondary | ICD-10-CM | POA: Diagnosis not present

## 2019-10-08 DIAGNOSIS — Z7982 Long term (current) use of aspirin: Secondary | ICD-10-CM | POA: Insufficient documentation

## 2019-10-08 DIAGNOSIS — Z87891 Personal history of nicotine dependence: Secondary | ICD-10-CM | POA: Diagnosis not present

## 2019-10-08 DIAGNOSIS — F17200 Nicotine dependence, unspecified, uncomplicated: Secondary | ICD-10-CM | POA: Diagnosis present

## 2019-10-08 DIAGNOSIS — Z20828 Contact with and (suspected) exposure to other viral communicable diseases: Secondary | ICD-10-CM | POA: Insufficient documentation

## 2019-10-08 DIAGNOSIS — E785 Hyperlipidemia, unspecified: Secondary | ICD-10-CM | POA: Insufficient documentation

## 2019-10-08 DIAGNOSIS — I10 Essential (primary) hypertension: Secondary | ICD-10-CM | POA: Diagnosis present

## 2019-10-08 DIAGNOSIS — I129 Hypertensive chronic kidney disease with stage 1 through stage 4 chronic kidney disease, or unspecified chronic kidney disease: Secondary | ICD-10-CM | POA: Insufficient documentation

## 2019-10-08 DIAGNOSIS — Z8679 Personal history of other diseases of the circulatory system: Secondary | ICD-10-CM

## 2019-10-08 LAB — BASIC METABOLIC PANEL
Anion gap: 11 (ref 5–15)
BUN: 13 mg/dL (ref 8–23)
CO2: 22 mmol/L (ref 22–32)
Calcium: 9.3 mg/dL (ref 8.9–10.3)
Chloride: 105 mmol/L (ref 98–111)
Creatinine, Ser: 1.61 mg/dL — ABNORMAL HIGH (ref 0.61–1.24)
GFR calc Af Amer: 49 mL/min — ABNORMAL LOW (ref >60)
GFR calc non Af Amer: 42 mL/min — ABNORMAL LOW (ref >60)
Glucose, Bld: 142 mg/dL — ABNORMAL HIGH (ref 70–99)
Potassium: 3.5 mmol/L (ref 3.5–5.1)
Sodium: 138 mmol/L (ref 135–145)

## 2019-10-08 LAB — HEPATIC FUNCTION PANEL
ALT: 24 U/L (ref 0–44)
AST: 35 U/L (ref 15–41)
Albumin: 3.9 g/dL (ref 3.5–5.0)
Alkaline Phosphatase: 66 U/L (ref 38–126)
Bilirubin, Direct: 0.2 mg/dL (ref 0.0–0.2)
Indirect Bilirubin: 0.6 mg/dL (ref 0.3–0.9)
Total Bilirubin: 0.8 mg/dL (ref 0.3–1.2)
Total Protein: 7.2 g/dL (ref 6.5–8.1)

## 2019-10-08 LAB — CBC
HCT: 46 % (ref 39.0–52.0)
Hemoglobin: 15.3 g/dL (ref 13.0–17.0)
MCH: 27.7 pg (ref 26.0–34.0)
MCHC: 33.3 g/dL (ref 30.0–36.0)
MCV: 83.3 fL (ref 80.0–100.0)
Platelets: 230 10*3/uL (ref 150–400)
RBC: 5.52 MIL/uL (ref 4.22–5.81)
RDW: 12.4 % (ref 11.5–15.5)
WBC: 7.1 10*3/uL (ref 4.0–10.5)
nRBC: 0 % (ref 0.0–0.2)

## 2019-10-08 LAB — TROPONIN I (HIGH SENSITIVITY)
Troponin I (High Sensitivity): 4 ng/L (ref <18)
Troponin I (High Sensitivity): 5 ng/L (ref <18)

## 2019-10-08 MED ORDER — ASPIRIN 300 MG RE SUPP: 300.0000 mg | RECTAL | Status: AC

## 2019-10-08 MED ORDER — ASPIRIN EC 81 MG PO TBEC
81.0000 mg | DELAYED_RELEASE_TABLET | Freq: Every day | ORAL | Status: DC
  Administered 2019-10-09: 11:00:00 81 mg via ORAL
  Filled 2019-10-08: qty 1

## 2019-10-08 MED ORDER — NITROGLYCERIN 0.4 MG SL SUBL
0.4000 mg | SUBLINGUAL_TABLET | SUBLINGUAL | Status: DC | PRN
  Administered 2019-10-08: 19:00:00 0.4 mg via SUBLINGUAL
  Filled 2019-10-08: qty 1

## 2019-10-08 MED ORDER — ONDANSETRON HCL 4 MG/2ML IJ SOLN: 4.0000 mg | Freq: Four times a day (QID) | INTRAMUSCULAR | Status: DC | PRN

## 2019-10-08 MED ORDER — HEPARIN SODIUM (PORCINE) 5000 UNIT/ML IJ SOLN
5000.0000 [IU] | Freq: Three times a day (TID) | INTRAMUSCULAR | Status: DC
  Administered 2019-10-09: 02:00:00 5000 [IU] via SUBCUTANEOUS
  Filled 2019-10-08 (×2): qty 1

## 2019-10-08 MED ORDER — SODIUM CHLORIDE 0.9% FLUSH
3.0000 mL | Freq: Once | INTRAVENOUS | Status: AC
  Administered 2019-10-08: 20:00:00 3 mL via INTRAVENOUS

## 2019-10-08 MED ORDER — ASPIRIN 81 MG PO CHEW
324.0000 mg | CHEWABLE_TABLET | ORAL | Status: AC
  Administered 2019-10-09: 324 mg via ORAL
  Filled 2019-10-08: qty 4

## 2019-10-08 MED ORDER — ACETAMINOPHEN 325 MG PO TABS: 650.0000 mg | ORAL_TABLET | ORAL | Status: DC | PRN

## 2019-10-08 NOTE — ED Provider Notes (Signed)
[facility]ERG[facility] DEPARTMENT Provider Note   CSN: [id number] Arrival date & time: 10/08/19  1709     History   Chief Complaint Chief Complaint  Patient presents with  . Chest Pain    HPI Tzvi Pott is a 73 y.o. male.     HPI   73yo male with history of DM, htn, hlpd, tobacco abuse, CVA, CKD, prior colon and prostate cancer, presents with concern for chest pain.  Chest pain began at 4PM while driving in the car, located middle of the chest with radiation to the back. Feels like tightness. 8/10 now, but was worse earlier.  Laid down a minute or two then told sister.  Thought maybe it was better laying down on his side, but not pleuritic, not worse with exertion. Had very mild diaphoresis at the time.  No dyspnea, nausea, vomiting, abdominal pain.  Took 324mg  of ASA prior to arrival.  Tightness improving now.     No leg pain or swelling, recent surgeries, long trips/car airplane  Family history of CAD, but not early fam hx.  Past Medical History:  Diagnosis Date  . Cancer [facility])    Colon, Prostate  . Diabetes mellitus without complication (HCC)    Borderline  . Hyperlipidemia   . Hypertension     Patient Active Problem List   Diagnosis Date Noted  . DIAB W/RENAL MANIFESTS TYPE II/UNS NOT UNCNTRL 09/20/2010  . TOBACCO ABUSE 07/20/2010  . ESSENTIAL HYPERTENSION, BENIGN 07/20/2010  . CHRONIC KIDNEY DISEASE STAGE III (MODERATE) 07/20/2010  . CEREBROVASCULAR ACCIDENT, HX OF 07/20/2010    Past Surgical History:  Procedure Laterality Date  . PROSTATECTOMY          Home Medications    Prior to Admission medications   Medication Sig Start Date End Date Taking? Authorizing Provider  amLODipine (NORVASC) 10 MG tablet Take 10 mg by mouth daily.      [provider]  aspirin 81 MG tablet      [provider]  hydrochlorothiazide 25 MG tablet Take 25 mg by mouth daily.      [provider]  methocarbamol (ROBAXIN)  500 MG tablet Take 1 tablet (500 mg total) by mouth 4 (four) times daily. 08/29/17   Carlisle Cater, PA-C  pravastatin (PRAVACHOL) 40 MG tablet Take 40 mg by mouth at bedtime.      [provider]    Family History History reviewed. No pertinent family history.  Social History Social History   Tobacco Use  . Smoking status: Former Research scientist (life sciences)  . Smokeless tobacco: Never Used  Substance Use Topics  . Alcohol use: No  . Drug use: No     Allergies   Patient has no known allergies.   Review of Systems Review of Systems  Constitutional: Positive for diaphoresis. Negative for fever.  HENT: Negative for sore throat.   Eyes: Negative for visual disturbance.  Respiratory: Positive for cough. Negative for shortness of breath.   Cardiovascular: Positive for chest pain. Negative for leg swelling.  Gastrointestinal: Negative for abdominal pain, nausea and vomiting.  Genitourinary: Negative for difficulty urinating.  Musculoskeletal: Negative for back pain and neck stiffness.  Skin: Negative for rash.  Neurological: Negative for syncope and headaches.     Physical Exam Updated Vital Signs BP (!) 144/62   Pulse 63   Temp 97.9 F (36.6 C) (Oral)   Resp 16   SpO2 99%   Physical Exam Vitals signs and nursing note reviewed.  Constitutional:  General: He is not in acute distress.    Appearance: He is well-developed. He is not diaphoretic.  HENT:     Head: Normocephalic and atraumatic.  Eyes:     Conjunctiva/sclera: Conjunctivae normal.  Neck:     Musculoskeletal: Normal range of motion.  Cardiovascular:     Rate and Rhythm: Normal rate and regular rhythm.     Heart sounds: Normal heart sounds. No murmur. No friction rub. No gallop.   Pulmonary:     Effort: Pulmonary effort is normal. No respiratory distress.     Breath sounds: Normal breath sounds. No wheezing or rales.  Abdominal:     General: There is no distension.     Palpations: Abdomen is soft.      Tenderness: There is no abdominal tenderness. There is no guarding.  Musculoskeletal:     Thoracic back: He exhibits tenderness (right side of back).  Skin:    General: Skin is warm and dry.  Neurological:     Mental Status: He is alert and oriented to person, place, and time.      ED Treatments / Results  Labs (all labs ordered are listed, but only abnormal results are displayed) Labs Reviewed  SARS CORONAVIRUS 2 (TAT 6-24 HRS)  CBC  BASIC METABOLIC PANEL  HEPATIC FUNCTION PANEL  TROPONIN I (HIGH SENSITIVITY)    EKG EKG Interpretation  Date/Time:  Thursday October 08 2019 17:15:26 EST Ventricular Rate:  62 PR Interval:  164 QRS Duration: 82 QT Interval:  414 QTC Calculation: 420 R Axis:   3 Text Interpretation: Normal sinus rhythm Low voltage QRS Inferior infarct , age undetermined Abnormal ECG Since prior ECG, nonspecific changes present, rate has slowed Confirmed by Gareth Morgan [phone]) on 10/08/2019 5:21:08 PM   Radiology Dg Chest Portable 1 View  Result Date: 10/08/2019 CLINICAL DATA:  Right-sided chest pain radiating to the back. EXAM: PORTABLE CHEST 1 VIEW COMPARISON:  Chest x-ray dated August 29, 2017. FINDINGS: The heart size and mediastinal contours are within normal limits. Atherosclerotic calcification of the aortic arch. Normal pulmonary vascularity. No focal consolidation, pleural effusion, or pneumothorax. No acute osseous abnormality. IMPRESSION: No active disease. Electronically Signed   By: Titus Dubin M.D.   On: 10/08/2019 17:57    Procedures Procedures (including critical care time)  Medications Ordered in ED Medications  sodium chloride flush (NS) 0.9 % injection 3 mL (has no administration in time range)  nitroGLYCERIN (NITROSTAT) SL tablet 0.4 mg (has no administration in time range)     Initial Impression / Assessment and Plan / ED Course  I have reviewed the triage vital signs and the nursing notes.  Pertinent labs & imaging  results that were available during my care of the patient were reviewed by me and considered in my medical decision making (see chart for details).        73yo male with history of DM, htn, hlpd, tobacco abuse, CVA, CKD, presents with concern for chest pain.  EKG with nonspecific changes. CXR without acute findings, has atherosclerosis of aortic arch, normal mediastinal contours.  Not having stabbing pain, has equal upper extremity blood pressures, equal pulses in upper and lower extremities, and has some back tenderness and doubt aortic dissection. No dyspnea, hypoxia, tachypnea, or asymmetric leg swelling, doubt PE.    Troponin 4 and 5.  Chest tightness with patient's risk factors make HEAR score 6. Do not see alternative cause of CP, no chest wall tenderness to suggest MSK, hx not suggestive of  GERD.  Discussed recommendation for admission for further evaluation given risk factors.  Pain improving spontaneously and completely resolved following nitroglycerin. Will admit for CP rule out to hospitalist service.   Final Clinical Impressions(s) / ED Diagnoses   Final diagnoses:  Chest pain, unspecified type    ED Discharge Orders    None       Gareth Morgan, MD 10/08/19 2324

## 2019-10-08 NOTE — ED Triage Notes (Signed)
Pt presents from home via EMS w/new onset Right side chest pain radiating to his back x1 hour. Denies any other symptoms. Pt reports having a cold x1 week, CP increases w/coughing.  Pt took 324 mg ASA prior to EMS arrival   150/80 70 16 98% RA  CBG 129

## 2019-10-08 NOTE — H&P (Signed)
History and Physical  Peter Levine [id number] DOB: [birth year] DOA: 10/08/2019  Referring physician: Gareth Morgan MD PCP: Cyndi Bender, PA-C  Patient coming from: Home   Chief Complaint: Chest pain  HPI: Peter Levine is a 73 y.o. male with medical history significant for attention, hyperlipidemia, type 2 diabetes mellitus, CVA, CKD III and tobacco abuse who presents to the emergency department due to right sternal chest pain with radiation to the back that was described as pressure-like and rated as 8/10 on pain scale.  Pain started around 4 PM while driving, this was associated with mild diaphoresis and was without any exacerbating factor.  He took aspirin 325 mg p.o. x1 prior to arrival at the ED.  He denies shortness of breath, fever, chills, nausea, vomiting or abdominal pain.  ED Course: In the emergency department, BP was 144/62, but other vital signs are within normal range.  Work-up in the ED showed normal CBC, troponin x2- 4>5, creatinine 1.61.  COVID-19 test was negative.  Chest x-ray showed no active disease.  Nitroglycerin was given and patient's chest pain resolved.  Hospitalist was asked to admit patient for further evaluation and management.  Review of Systems: Review of Systems  Constitutional: Positive for diaphoresis.  Negative for chills and fever.  HENT: Negative for ear pain and sore throat.   Eyes: Negative for pain and visual disturbance.  Respiratory: Negative for cough, chest tightness and shortness of breath.   Cardiovascular: Positive for chest pain.  Negative for palpitations.  Gastrointestinal: Negative for abdominal pain and vomiting.  Endocrine: Negative for polyphagia and polyuria.  Genitourinary: Negative for decreased urine volume, dysuria, enuresis Musculoskeletal: Negative for arthralgias and back pain.  Skin: Negative for color change and rash.  Allergic/Immunologic: Negative for immunocompromised state.  Neurological: Negative for  tremors, syncope, speech difficulty, weakness, light-headedness and headaches.  Hematological: Does not bruise/bleed easily.  All other systems reviewed and are negative    Past Medical History:  Diagnosis Date  . Cancer [facility])    Colon, Prostate  . Diabetes mellitus without complication (HCC)    Borderline  . Hyperlipidemia   . Hypertension    Past Surgical History:  Procedure Laterality Date  . PROSTATECTOMY      Social History:  reports that he has quit smoking. He has never used smokeless tobacco. He reports that he does not drink alcohol or use drugs.   No Known Allergies  History reviewed. No pertinent family history.    Prior to Admission medications   Medication Sig Start Date End Date Taking? Authorizing Provider  amLODipine (NORVASC) 10 MG tablet Take 10 mg by mouth daily.     Yes [provider]  aspirin 81 MG tablet Take 81 mg by mouth daily.    Yes [provider]  lisinopril (ZESTRIL) 10 MG tablet Take 10 mg by mouth daily. 08/24/19  Yes [provider]  metFORMIN (GLUCOPHAGE-XR) 500 MG 24 hr tablet Take 1,000 mg by mouth daily. 09/03/19  Yes [provider]  pravastatin (PRAVACHOL) 40 MG tablet Take 40 mg by mouth at bedtime.     Yes [provider]  hydrochlorothiazide 25 MG tablet Take 25 mg by mouth daily.      [provider]  methocarbamol (ROBAXIN) 500 MG tablet Take 1 tablet (500 mg total) by mouth 4 (four) times daily. 08/29/17   Carlisle Cater, PA-C    Physical Exam: BP 127/66   Pulse (!) 56   Temp 97.9 F (36.6 C) (Oral)  Resp 19   Ht 5\' 9"  (1.753 m)   Wt 94.3 kg   SpO2 98%   BMI 30.72 kg/m   . General: 73 y.o. year-old male well developed well nourished in no acute distress.  Alert and oriented x3. Marland Kitchen HEENT: NCAT, PERRLA . NECK: Supple, trachea midline . Cardiovascular: Regular rate and rhythm with no rubs or gallops.  No thyromegaly or JVD noted.  No lower extremity edema. 2/4 pulses in  all 4 extremities. Marland Kitchen Respiratory: Clear to auscultation with no wheezes or rales. Good inspiratory effort. . Abdomen: Soft nontender nondistended with normal bowel sounds x4 quadrants. . Muskuloskeletal: Mild tenderness on palpation of right side of back.  No cyanosis, clubbing or edema noted bilaterally . Neuro: CN II-XII intact, strength, sensation, reflexes . Skin: No ulcerative lesions noted or rashes . Psychiatry: Judgement and insight appear normal. Mood is appropriate for condition and setting          Labs on Admission:  Basic Metabolic Panel: Recent Labs  Lab 10/08/19 1741  NA 138  K 3.5  CL 105  CO2 22  GLUCOSE 142*  BUN 13  CREATININE 1.61*  CALCIUM 9.3   Liver Function Tests: Recent Labs  Lab 10/08/19 1741  AST 35  ALT 24  ALKPHOS 66  BILITOT 0.8  PROT 7.2  ALBUMIN 3.9   No results for input(s): LIPASE, AMYLASE in the last 168 hours. No results for input(s): AMMONIA in the last 168 hours. CBC: Recent Labs  Lab 10/08/19 1741  WBC 7.1  HGB 15.3  HCT 46.0  MCV 83.3  PLT 230   Cardiac Enzymes: No results for input(s): CKTOTAL, CKMB, CKMBINDEX, TROPONINI in the last 168 hours.  BNP (last 3 results) No results for input(s): BNP in the last 8760 hours.  ProBNP (last 3 results) No results for input(s): PROBNP in the last 8760 hours.  CBG: No results for input(s): GLUCAP in the last 168 hours.  Radiological Exams on Admission: Dg Chest Portable 1 View  Result Date: 10/08/2019 CLINICAL DATA:  Right-sided chest pain radiating to the back. EXAM: PORTABLE CHEST 1 VIEW COMPARISON:  Chest x-ray dated August 29, 2017. FINDINGS: The heart size and mediastinal contours are within normal limits. Atherosclerotic calcification of the aortic arch. Normal pulmonary vascularity. No focal consolidation, pleural effusion, or pneumothorax. No acute osseous abnormality. IMPRESSION: No active disease. Electronically Signed   By: Titus Dubin M.D.   On: 10/08/2019  17:57    EKG: I independently viewed the EKG done and my findings are as followed: Normal sinus rhythm at rate of 62/min with T wave inversion in leads II, III and aVF.  Assessment/Plan Present on Admission: . Chest pain . Essential hypertension, benign . CHRONIC KIDNEY DISEASE STAGE III (MODERATE) . TOBACCO ABUSE  Principal Problem:   Chest pain Active Problems:   TOBACCO ABUSE   Essential hypertension, benign   CHRONIC KIDNEY DISEASE STAGE III (MODERATE)   History of cardiovascular disorder   Hyperglycemia due to diabetes mellitus (HCC)   Chest pain rule out ACS  Cardiovascular risk factors include hypertension, hyperlipidemia, diabetes mellitus, history of stroke.  Patient will be admitted to telemetry unit and placed on observation Troponin  x2- 4>5, Consider cardiology consult to help decide if Stress test is needed in am Versus other diagnostic modalities.   Continue aspirin, nitroglycerin prn  Normal sinus rhythm at rate of 62/min with T wave inversion in leads II, III and aVF. EKG will be repeated   Hyperglycemia secondary  to type 2 diabetes mellitus Continue sliding scale and hypoglycemia protocol  History of CKD 3 Creatinine on admission= 1.6, no recent labs for comparison.   Renally adjust medications, avoid nephrotoxic agents/dehydration/hypotension  Hypertension Continue home meds when med rec is updated  Hyperlipidemia Continue home meds when med rec is updated  History of CVA Continue home meds when med rec is updated  DVT prophylaxis: Heparin  Code Status: Full code  Family Communication: None at bedside  Disposition Plan: Home once clinically stable  Consults called: None  Admission status: Observation    Bernadette Hoit MD [facility]  If 7PM-7AM, please contact night-coverage [email or web address]  10/09/2019, 3:07 AM

## 2019-10-09 ENCOUNTER — Other Ambulatory Visit: Payer: Self-pay | Admitting: Cardiology

## 2019-10-09 ENCOUNTER — Telehealth: Payer: Self-pay

## 2019-10-09 DIAGNOSIS — R079 Chest pain, unspecified: Secondary | ICD-10-CM

## 2019-10-09 DIAGNOSIS — R072 Precordial pain: Secondary | ICD-10-CM

## 2019-10-09 DIAGNOSIS — E1165 Type 2 diabetes mellitus with hyperglycemia: Secondary | ICD-10-CM

## 2019-10-09 DIAGNOSIS — N183 Chronic kidney disease, stage 3 unspecified: Secondary | ICD-10-CM

## 2019-10-09 DIAGNOSIS — I10 Essential (primary) hypertension: Secondary | ICD-10-CM

## 2019-10-09 LAB — CBC
HCT: 44.1 % (ref 39.0–52.0)
Hemoglobin: 14.7 g/dL (ref 13.0–17.0)
MCH: 28 pg (ref 26.0–34.0)
MCHC: 33.3 g/dL (ref 30.0–36.0)
MCV: 84 fL (ref 80.0–100.0)
Platelets: 228 10*3/uL (ref 150–400)
RBC: 5.25 MIL/uL (ref 4.22–5.81)
RDW: 12.7 % (ref 11.5–15.5)
WBC: 7 10*3/uL (ref 4.0–10.5)
nRBC: 0 % (ref 0.0–0.2)

## 2019-10-09 LAB — CREATININE, SERUM
Creatinine, Ser: 1.46 mg/dL — ABNORMAL HIGH (ref 0.61–1.24)
GFR calc Af Amer: 55 mL/min — ABNORMAL LOW (ref >60)
GFR calc non Af Amer: 47 mL/min — ABNORMAL LOW (ref >60)

## 2019-10-09 LAB — HEMOGLOBIN A1C
Hgb A1c MFr Bld: 7.7 % — ABNORMAL HIGH (ref 4.8–5.6)
Mean Plasma Glucose: 174.29 mg/dL

## 2019-10-09 LAB — GLUCOSE, CAPILLARY
Glucose-Capillary: 105 mg/dL — ABNORMAL HIGH (ref 70–99)
Glucose-Capillary: 140 mg/dL — ABNORMAL HIGH (ref 70–99)

## 2019-10-09 LAB — SARS CORONAVIRUS 2 (TAT 6-24 HRS): SARS Coronavirus 2: NEGATIVE

## 2019-10-09 LAB — CBG MONITORING, ED: Glucose-Capillary: 98 mg/dL (ref 70–99)

## 2019-10-09 MED ORDER — INSULIN ASPART 100 UNIT/ML ~~LOC~~ SOLN: 0.0000 [IU] | Freq: Every day | SUBCUTANEOUS | Status: DC

## 2019-10-09 MED ORDER — INSULIN ASPART 100 UNIT/ML ~~LOC~~ SOLN: 0.0000 [IU] | Freq: Three times a day (TID) | SUBCUTANEOUS | Status: DC

## 2019-10-09 NOTE — Progress Notes (Signed)
NURSING PROGRESS NOTE  Peter Levine [id number] Discharge Data: 10/09/2019 4:17 PM Attending Provider: Domenic Polite, MD [id number], Tamala Julian     Caren Griffins to be D/C'd Home per MD order.  Discussed with the patient the After Visit Summary and all questions fully answered. All IV's discontinued with no bleeding noted. All belongings returned to patient for patient to take home.   Last Vital Signs:  Blood pressure 130/71, pulse 66, temperature 98.5 F (36.9 C), temperature source Oral, resp. rate 16, height 5\' 9"  (1.753 m), weight 94.3 kg, SpO2 100 %.  Discharge Medication List Allergies as of 10/09/2019   No Known Allergies     Medication List    TAKE these medications   amLODipine 10 MG tablet Commonly known as: NORVASC Take 10 mg by mouth daily. Notes to patient: 10/09/2019   aspirin 81 MG tablet Take 81 mg by mouth daily. Notes to patient: 10/10/2019   hydrochlorothiazide 25 MG tablet Commonly known as: HYDRODIURIL Take 25 mg by mouth daily. Notes to patient: 10/09/2019   lisinopril 10 MG tablet Commonly known as: ZESTRIL Take 10 mg by mouth daily. Notes to patient: 10/09/2019   metFORMIN 500 MG 24 hr tablet Commonly known as: GLUCOPHAGE-XR Take 1,000 mg by mouth daily. Notes to patient: 10/09/2019   methocarbamol 500 MG tablet Commonly known as: ROBAXIN Take 1 tablet (500 mg total) by mouth 4 (four) times daily. Notes to patient: 10/09/2019   pravastatin 40 MG tablet Commonly known as: PRAVACHOL Take 40 mg by mouth at bedtime. Notes to patient: 10/09/2019

## 2019-10-09 NOTE — ED Notes (Signed)
Blood draw x2 attempted unsuccessful. RN Anderson Malta

## 2019-10-09 NOTE — Discharge Summary (Signed)
Physician Discharge Summary  Peter Levine [id number] DOB: 1946/09/23 DOA: 10/08/2019  PCP: Cyndi Bender, PA-C  Admit date: 10/08/2019 Discharge date: 10/09/2019  Time spent: 35 minutes  Recommendations for Outpatient Follow-up:  [facility], office will call pt for stress test   Discharge Diagnoses:  Principal Problem:   Chest pain Active Problems:   TOBACCO ABUSE   Essential hypertension, benign   CHRONIC KIDNEY DISEASE STAGE III (MODERATE)   History of cardiovascular disorder   Hyperglycemia due to diabetes mellitus ([place])   Discharge Condition: stable  Diet recommendation: Diabetic, heart healthy  Filed Weights   10/08/19 1958  Weight: 94.3 kg    History of present illness:   73yo male with PMH TIIDM, CKD III who presented with right sided chest and back pressure relieved with nitro.    Hospital Course:   Substernal chest pain  -Resolved after sublingual nitroglycerin yesterday, EKG was unremarkable and high-sensitivity troponins were negative -Due to extensive list of cardiac risk factors cardiology was consulted -They recommended outpatient Lexiscan which they will arrange -Patient discharged home in a stable condition  Rest of his chronic medical problems are stable, counseled regarding smoking cessation  Consultations:  Cardiology  Discharge Exam: Vitals:   10/09/19 1115 10/09/19 1137  BP: 113/67 130/71  Pulse: 60 66  Resp: 18 16  Temp:  98.5 F (36.9 C)  SpO2: 100% 100%    General: AAOx3 Cardiovascular: S1-S2/regular rate rhythm Respiratory: Clear  Discharge Instructions    Allergies as of 10/09/2019   No Known Allergies     Medication List    TAKE these medications   amLODipine 10 MG tablet Commonly known as: NORVASC Take 10 mg by mouth daily.   aspirin 81 MG tablet Take 81 mg by mouth daily.   hydrochlorothiazide 25 MG tablet Commonly known as: HYDRODIURIL Take 25 mg by mouth daily.   lisinopril 10 MG  tablet Commonly known as: ZESTRIL Take 10 mg by mouth daily.   metFORMIN 500 MG 24 hr tablet Commonly known as: GLUCOPHAGE-XR Take 1,000 mg by mouth daily.   methocarbamol 500 MG tablet Commonly known as: ROBAXIN Take 1 tablet (500 mg total) by mouth 4 (four) times daily.   pravastatin 40 MG tablet Commonly known as: PRAVACHOL Take 40 mg by mouth at bedtime.      No Known Allergies Follow-up Information    Cyndi Bender, PA-C. Schedule an appointment as soon as possible for a visit in 1 week(s).   Specialty: Physician Assistant Contact information: [place] [address] [phone]            The results of significant diagnostics from this hospitalization (including imaging, microbiology, ancillary and laboratory) are listed below for reference.    Significant Diagnostic Studies: Dg Chest Portable 1 View  Result Date: 10/08/2019 CLINICAL DATA:  Right-sided chest pain radiating to the back. EXAM: PORTABLE CHEST 1 VIEW COMPARISON:  Chest x-ray dated August 29, 2017. FINDINGS: The heart size and mediastinal contours are within normal limits. Atherosclerotic calcification of the aortic arch. Normal pulmonary vascularity. No focal consolidation, pleural effusion, or pneumothorax. No acute osseous abnormality. IMPRESSION: No active disease. Electronically Signed   By: Titus Dubin M.D.   On: 10/08/2019 17:57    Microbiology: Recent Results (from the past 240 hour(s))  SARS CORONAVIRUS 2 (TAT 6-24 HRS) Nasopharyngeal Nasopharyngeal Swab     Status: None   Collection Time: 10/08/19  6:25 PM   Specimen: Nasopharyngeal Swab  Result Value Ref Range  Status   SARS Coronavirus 2 NEGATIVE NEGATIVE Final    Comment: (NOTE) SARS-CoV-2 target nucleic acids are NOT DETECTED. The SARS-CoV-2 RNA is generally detectable in upper and lower respiratory specimens during the acute phase of infection. Negative results do not preclude SARS-CoV-2 infection, do not rule  out co-infections with other pathogens, and should not be used as the sole basis for treatment or other patient management decisions. Negative results must be combined with clinical observations, patient history, and epidemiological information. The expected result is Negative. Fact Sheet for Patients: SugarRoll.be Fact Sheet for Healthcare Providers: https://www.woods-mathews.com/ This test is not yet approved or cleared by the [place] FDA and  has been authorized for detection and/or diagnosis of SARS-CoV-2 by FDA under an Emergency Use Authorization (EUA). This EUA will remain  in effect (meaning this test can be used) for the duration of the COVID-19 declaration under Section 56 4(b)(1) of the Act, 21 U.S.C. section 360bbb-3(b)(1), unless the authorization is terminated or revoked sooner. Performed at [facility], [place] [address], [place], [place] [address]      Labs: Basic Metabolic Panel: Recent Labs  Lab 10/08/19 1741 10/09/19 0417  NA 138  --   K 3.5  --   CL 105  --   CO2 22  --   GLUCOSE 142*  --   BUN 13  --   CREATININE 1.61* 1.46*  CALCIUM 9.3  --    Liver Function Tests: Recent Labs  Lab 10/08/19 1741  AST 35  ALT 24  ALKPHOS 66  BILITOT 0.8  PROT 7.2  ALBUMIN 3.9   No results for input(s): LIPASE, AMYLASE in the last 168 hours. No results for input(s): AMMONIA in the last 168 hours. CBC: Recent Labs  Lab 10/08/19 1741 10/09/19 0417  WBC 7.1 7.0  HGB 15.3 14.7  HCT 46.0 44.1  MCV 83.3 84.0  PLT 230 228   Cardiac Enzymes: No results for input(s): CKTOTAL, CKMB, CKMBINDEX, TROPONINI in the last 168 hours. BNP: BNP (last 3 results) No results for input(s): BNP in the last 8760 hours.  ProBNP (last 3 results) No results for input(s): PROBNP in the last 8760 hours.  CBG: Recent Labs  Lab 10/09/19 0831 10/09/19 1234  GLUCAP 98 105*       Signed:  Domenic Polite MD.  [facility] 10/09/2019, 3:48 PM

## 2019-10-09 NOTE — Consult Note (Addendum)
Cardiology Consultation     Patient ID: Peter Levine, [id number], Feb 12, 1946 Admit date: 10/08/2019 Date of Consult: 10/09/2019  Primary Physician: Cyndi Bender, PA-C Primary Cardiologist: Dr. Sherren Mocha, MD Referring Physician: Dr. Domenic Polite, MD  Chief Complaint: Chest pressure Reason for Consultation:  Chest pressure  HPI: This is a 73 y.o. male with a past medical history significant for CKD III, TIIDM, and CVA who presented via EMS with right sided chest and back pressure and diaphoresis that occurred while driving. He denied chest pain but did feel like something was poking into his back. He was given nitro by EMS which relieved his discomfort. He states this has never happened before. He denies nausea or vomiting, arm or jaw numbness. He has had a recent congestion with non-productive cough. He denies fever, chills, lower extremity swelling, SOB at rest. He does start to feel short of breath after standing for 5-10 minutes but is able to ambulate ok most of the time. He has no difficulty lying flat and sleeps well throughout the night. He was told in the past that he snores.  He has a very remote smoking history as a teen and does not drink alcohol or use drugs. He has no recent changes in medications.   PMHx:  CARDIAC HISTORY: echocardiogram Echo:  2011: Normal contractility  Past Medical History:  Diagnosis Date  . Cancer [facility])    Colon, Prostate  . Diabetes mellitus without complication (HCC)    Borderline  . Hyperlipidemia   . Hypertension    Past Surgical History:  Procedure Laterality Date  . PROSTATECTOMY      FAMHx: History reviewed. No pertinent family history.  SOCHx:  reports that he has quit smoking. He has never used smokeless tobacco. He reports that he does not drink alcohol or use drugs.  ALLERGIES: No Known Allergies   HOME MEDICATIONS: Medications Prior to Admission  Medication Sig Dispense Refill Last Dose  . amLODipine  (NORVASC) 10 MG tablet Take 10 mg by mouth daily.     10/08/2019 at Unknown time  . aspirin 81 MG tablet Take 81 mg by mouth daily.    10/08/2019 at Unknown time  . lisinopril (ZESTRIL) 10 MG tablet Take 10 mg by mouth daily.   10/08/2019 at Unknown time  . metFORMIN (GLUCOPHAGE-XR) 500 MG 24 hr tablet Take 1,000 mg by mouth daily.   10/07/2019 at Unknown time  . pravastatin (PRAVACHOL) 40 MG tablet Take 40 mg by mouth at bedtime.     10/07/2019 at Unknown time  . hydrochlorothiazide 25 MG tablet Take 25 mg by mouth daily.       . methocarbamol (ROBAXIN) 500 MG tablet Take 1 tablet (500 mg total) by mouth 4 (four) times daily. 20 tablet 0     HOSPITAL MEDICATIONS: . aspirin EC  81 mg Oral Daily  . heparin  5,000 Units Subcutaneous Q8H  . insulin aspart  0-5 Units Subcutaneous QHS  . insulin aspart  0-9 Units Subcutaneous TID WC    ROS 10 point ROS negative except as noted in HPI  VITALS: Blood pressure 130/71, pulse 66, temperature 98.5 F (36.9 C), temperature source Oral, resp. rate 16, height 5\' 9"  (1.753 m), weight 94.3 kg, SpO2 100 %.  PHYSICAL EXAM: General appearance: alert, cooperative and appears stated age Neck: no adenopathy, no carotid bruit and no JVD Lungs: clear to auscultation bilaterally and no w/r/r Heart: regular rate and rhythm, S1, S2 normal, no murmur, click, rub or gallop Abdomen: normal  findings: bowel sounds normal and soft, non-tender Extremities: no edema, redness or tenderness in the calves or thighs Pulses: 2+ and symmetric Skin: Skin color, texture, turgor normal. No rashes or lesions Neurologic: Grossly normal  ECG (independently read by me): NSR  LABS: Results for orders placed or performed during the hospital encounter of 10/08/19 (from the past 48 hour(s))  Basic metabolic panel     Status: Abnormal   Collection Time: 10/08/19  5:41 PM  Result Value Ref Range   Sodium 138 135 - 145 mmol/L   Potassium 3.5 3.5 - 5.1 mmol/L   Chloride 105 98 -  111 mmol/L   CO2 22 22 - 32 mmol/L   Glucose, Bld 142 (H) 70 - 99 mg/dL   BUN 13 8 - 23 mg/dL   Creatinine, Ser 1.61 (H) 0.61 - 1.24 mg/dL   Calcium 9.3 8.9 - 10.3 mg/dL   GFR calc non Af Amer 42 (L) >60 mL/min   GFR calc Af Amer 49 (L) >60 mL/min   Anion gap 11 5 - 15    Comment: Performed at [facility], [address] [place] [address]  CBC     Status: None   Collection Time: 10/08/19  5:41 PM  Result Value Ref Range   WBC 7.1 4.0 - 10.5 K/uL   RBC 5.52 4.22 - 5.81 MIL/uL   Hemoglobin 15.3 13.0 - 17.0 g/dL   HCT 46.0 39.0 - 52.0 %   MCV 83.3 80.0 - 100.0 fL   MCH 27.7 26.0 - 34.0 pg   MCHC 33.3 30.0 - 36.0 g/dL   RDW 12.4 11.5 - 15.5 %   Platelets 230 150 - 400 K/uL   nRBC 0.0 0.0 - 0.2 %    Comment: Performed at [facility], [place] [address], [place], [place] [address]  Troponin I (High Sensitivity)     Status: None   Collection Time: 10/08/19  5:41 PM  Result Value Ref Range   Troponin I (High Sensitivity) 4 <18 ng/L    Comment: (NOTE) Elevated high sensitivity troponin I (hsTnI) values and significant  changes across serial measurements may suggest ACS but many other  chronic and acute conditions are known to elevate hsTnI results.  Refer to the "Links" section for chest pain algorithms and additional  guidance. Performed at [facility], [place] [address] [place], [place] [address]   Hepatic function panel     Status: None   Collection Time: 10/08/19  5:41 PM  Result Value Ref Range   Total Protein 7.2 6.5 - 8.1 g/dL   Albumin 3.9 3.5 - 5.0 g/dL   AST 35 15 - 41 U/L   ALT 24 0 - 44 U/L   Alkaline Phosphatase 66 38 - 126 U/L   Total Bilirubin 0.8 0.3 - 1.2 mg/dL   Bilirubin, Direct 0.2 0.0 - 0.2 mg/dL   Indirect Bilirubin 0.6 0.3 - 0.9 mg/dL    Comment: Performed at [facility] [address] [place], [place] [address]  SARS CORONAVIRUS 2 (TAT 6-24 HRS) Nasopharyngeal Nasopharyngeal Swab     Status: None   Collection Time:  10/08/19  6:25 PM   Specimen: Nasopharyngeal Swab  Result Value Ref Range   SARS Coronavirus 2 NEGATIVE NEGATIVE    Comment: (NOTE) SARS-CoV-2 target nucleic acids are NOT DETECTED. The SARS-CoV-2 RNA is generally detectable in upper and lower respiratory specimens during the acute phase of infection. Negative results do not preclude  SARS-CoV-2 infection, do not rule out co-infections with other pathogens, and should not be used as the sole basis for treatment or other patient management decisions. Negative results must be combined with clinical observations, patient history, and epidemiological information. The expected result is Negative. Fact Sheet for Patients: SugarRoll.be Fact Sheet for Healthcare Providers: https://www.woods-mathews.com/ This test is not yet approved or cleared by the [place] FDA and  has been authorized for detection and/or diagnosis of SARS-CoV-2 by FDA under an Emergency Use Authorization (EUA). This EUA will remain  in effect (meaning this test can be used) for the duration of the COVID-19 declaration under Section 56 4(b)(1) of the Act, 21 U.S.C. section 360bbb-3(b)(1), unless the authorization is terminated or revoked sooner. Performed at [facility], [place] [address], [place], [place] [address]   Troponin I (High Sensitivity)     Status: None   Collection Time: 10/08/19  7:57 PM  Result Value Ref Range   Troponin I (High Sensitivity) 5 <18 ng/L    Comment: (NOTE) Elevated high sensitivity troponin I (hsTnI) values and significant  changes across serial measurements may suggest ACS but many other  chronic and acute conditions are known to elevate hsTnI results.  Refer to the "Links" section for chest pain algorithms and additional  guidance. Performed at [facility], [place] [address] [place] [address]   CBC     Status: None   Collection Time: 10/09/19  4:17 AM  Result Value Ref  Range   WBC 7.0 4.0 - 10.5 K/uL   RBC 5.25 4.22 - 5.81 MIL/uL   Hemoglobin 14.7 13.0 - 17.0 g/dL   HCT 44.1 39.0 - 52.0 %   MCV 84.0 80.0 - 100.0 fL   MCH 28.0 26.0 - 34.0 pg   MCHC 33.3 30.0 - 36.0 g/dL   RDW 12.7 11.5 - 15.5 %   Platelets 228 150 - 400 K/uL   nRBC 0.0 0.0 - 0.2 %    Comment: Performed at [facility], [place] [address], [place], [place] [address]  Creatinine, serum     Status: Abnormal   Collection Time: 10/09/19  4:17 AM  Result Value Ref Range   Creatinine, Ser 1.46 (H) 0.61 - 1.24 mg/dL   GFR calc non Af Amer 47 (L) >60 mL/min   GFR calc Af Amer 55 (L) >60 mL/min    Comment: Performed at [facility] [address] [place], [place] [address]  Hemoglobin A1c     Status: Abnormal   Collection Time: 10/09/19  4:17 AM  Result Value Ref Range   Hgb A1c MFr Bld 7.7 (H) 4.8 - 5.6 %    Comment: (NOTE) Pre diabetes:          5.7%-6.4% Diabetes:              >6.4% Glycemic control for   <7.0% adults with diabetes    Mean Plasma Glucose 174.29 mg/dL    Comment: Performed at [facility] [address] [place], [place] [address]  CBG monitoring, ED     Status: None   Collection Time: 10/09/19  8:31 AM  Result Value Ref Range   Glucose-Capillary 98 70 - 99 mg/dL  Glucose, capillary     Status: Abnormal   Collection Time: 10/09/19 12:34 PM  Result Value Ref Range   Glucose-Capillary 105 (H) 70 - 99 mg/dL    IMAGING: Dg Chest Portable 1 View  Result Date: 10/08/2019  CLINICAL DATA:  Right-sided chest pain radiating to the back. EXAM: PORTABLE CHEST 1 VIEW COMPARISON:  Chest x-ray dated August 29, 2017. FINDINGS: The heart size and mediastinal contours are within normal limits. Atherosclerotic calcification of the aortic arch. Normal pulmonary vascularity. No focal consolidation, pleural effusion, or pneumothorax. No acute osseous abnormality. IMPRESSION: No active disease. Electronically Signed   By: Titus Dubin M.D.   On: 10/08/2019 17:57     IMPRESSION:  73yo male with PMH TIIDM, CKD III who presented with right sided chest and back pressure relieved with nitro.    RECOMMENDATION:  Chest pain resolved with nitro and has not recurred since admission. Troponinsx2 wnl. ECG yesterday and today within normal limits, mild asymptomatic bradycardia which is not present on telemetry today. TIMI score of 3 and heart score 4 with multiple risk factors for cardiac disease. Will have him follow-up outpatient for lexiscan stress test.  - cont. Blood pressure medications - TSH, lipid panel today - consider high intensity statin, although this may not be affordable for him.   Seawell, Jaimie A, DO 10/09/2019, 1:34 PM Pager: [phone]  Patient seen, examined. Available data reviewed. Agree with findings, assessment, and plan as outlined by Molli Hazard, DO. On my exam: Vitals:   10/09/19 1115 10/09/19 1137  BP: 113/67 130/71  Pulse: 60 66  Resp: 18 16  Temp:  98.5 F (36.9 C)  SpO2: 100% 100%   Pt is alert and oriented, NAD HEENT: normal Neck: JVP - normal, carotids 2+= without bruits Lungs: CTA bilaterally CV: RRR with soft systolic murmur at the apex (1/6) Abd: soft, NT, Positive BS, no hepatomegaly Ext: no C/C/E, distal pulses intact and equal Skin: warm/dry no rash  EKG shows sinus rhythm with no ischemic changes. HS-trop negative x 2.  Patient symptoms had some typical and atypical features but he has had no recurrence in the hospital.  With negative high-sensitivity troponin x2, he is not at high risk with respect to ACS.  I think it is okay for him to be discharged from the hospital with an outpatient pharmacologic stress test which will be arranged.  He understands that if chest pain recurs he needs to seek immediate medical attention.  Otherwise we will arrange outpatient follow-up after his stress test is completed. Otherwise as outlined above with continued treatment of diabetes/HTN.   Sherren Mocha,  M.D. 10/09/2019 5:04 PM

## 2019-10-09 NOTE — Telephone Encounter (Signed)
-----   Message from Tommie Raymond, NP sent at 10/09/2019  2:54 PM EST ----- Can you guys help me schedule this patient for outpatient echocardiogram as well as a Lexiscan stress test? Pt seen in the hospital as a new consult by Dr. Burt Knack and cardiology resident.    Thank you  Sharee Pimple

## 2019-10-15 ENCOUNTER — Telehealth (HOSPITAL_COMMUNITY): Payer: Self-pay | Admitting: *Deleted

## 2019-10-15 NOTE — Telephone Encounter (Signed)
Patient given detailed instructions per Myocardial Perfusion Study Information Sheet for the test on 10/21/19. Patient notified to arrive 15 minutes early and that it is imperative to arrive on time for appointment to keep from having the test rescheduled.  If you need to cancel or reschedule your appointment, please call the office within 24 hours of your appointment. . Patient verbalized understanding. Kirstie Peri

## 2019-10-21 ENCOUNTER — Ambulatory Visit (HOSPITAL_COMMUNITY): Payer: Medicare Other | Attending: Cardiovascular Disease

## 2019-10-21 ENCOUNTER — Ambulatory Visit (HOSPITAL_BASED_OUTPATIENT_CLINIC_OR_DEPARTMENT_OTHER): Payer: Medicare Other

## 2019-10-21 ENCOUNTER — Other Ambulatory Visit: Payer: Self-pay

## 2019-10-21 DIAGNOSIS — R079 Chest pain, unspecified: Secondary | ICD-10-CM | POA: Insufficient documentation

## 2019-10-21 LAB — MYOCARDIAL PERFUSION IMAGING
LV dias vol: 89 mL (ref 62–150)
LV sys vol: 35 mL
Peak BP: 15780 mmHg
Peak HR: 70 {beats}/min
Rest HR: 59 {beats}/min
SDS: 2
SRS: 2
SSS: 4
TID: 1.08

## 2019-10-21 MED ORDER — TECHNETIUM TC 99M TETROFOSMIN IV KIT
10.2000 | PACK | Freq: Once | INTRAVENOUS | Status: AC | PRN
  Administered 2019-10-21: 10.2 via INTRAVENOUS
  Filled 2019-10-21: qty 11

## 2019-10-21 MED ORDER — TECHNETIUM TC 99M TETROFOSMIN IV KIT
31.4000 | PACK | Freq: Once | INTRAVENOUS | Status: AC | PRN
  Administered 2019-10-21: 31.4 via INTRAVENOUS
  Filled 2019-10-21: qty 32

## 2019-10-21 MED ORDER — REGADENOSON 0.4 MG/5ML IV SOLN
0.4000 mg | Freq: Once | INTRAVENOUS | Status: AC
  Administered 2019-10-21: 0.4 mg via INTRAVENOUS

## 2019-10-30 ENCOUNTER — Ambulatory Visit: Payer: Medicare Other | Admitting: Cardiology

## 2021-04-17 ENCOUNTER — Other Ambulatory Visit (HOSPITAL_COMMUNITY): Payer: Self-pay | Admitting: Urology

## 2021-04-17 DIAGNOSIS — C61 Malignant neoplasm of prostate: Secondary | ICD-10-CM

## 2021-05-05 ENCOUNTER — Other Ambulatory Visit: Payer: Self-pay

## 2021-05-05 ENCOUNTER — Ambulatory Visit (HOSPITAL_COMMUNITY)
Admission: RE | Admit: 2021-05-05 | Discharge: 2021-05-05 | Disposition: A | Discharge status: Home / Self Care | Payer: Medicare Other | Source: Ambulatory Visit | Attending: Urology | Admitting: Urology

## 2021-05-05 DIAGNOSIS — C61 Malignant neoplasm of prostate: Secondary | ICD-10-CM | POA: Insufficient documentation

## 2021-05-05 MED ORDER — PIFLIFOLASTAT F 18 (PYLARIFY) INJECTION
9.0000 | Freq: Once | INTRAVENOUS | Status: AC
  Administered 2021-05-05: 9.2 via INTRAVENOUS

## 2021-11-20 IMAGING — CT NM PET TUM IMG SKULL BASE T - THIGH
1 of 7 series · 1 of 25 positions shown · non-contrast
Comparison: CT and MDP bone scan 04/11/2021

CLINICAL DATA: Prostate carcinoma with biochemical recurrence. Post
prostatectomy. Sclerotic pelvic lesion identified on CT exam.

EXAM:
NUCLEAR MEDICINE PET SKULL BASE TO THIGH
TECHNIQUE: 9.2 mCi F18 Piflufolastat (Pylarify) was injected intravenously.
Full-ring PET imaging was performed from the skull base to thigh
after the radiotracer. CT data was obtained and used for attenuation
correction and anatomic localization.

[Series 3: pet sk_thigh ac · axial · 5.0mm · 4.07mm/px · 1 of 247 slices shown]
[im 148/247]
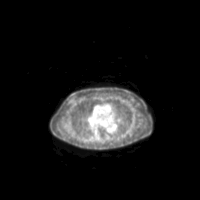

[1 of 25 positions shown; findings below may reference images not displayed]

FINDINGS: NECK

No radiotracer activity in neck lymph nodes.

Incidental CT finding: None

CHEST

Mild radiotracer activity associated with a normal size subcarinal
lymph node measuring 5 mm short axis with SUV max equal 4.8. Mild
activity associated with small LEFT axillary lymph node with SUV max
equal 2.6 on image 81.

Incidental CT finding: No suspicious pulmonary nodules.

ABDOMEN/PELVIS

Prostate: No activity in prostatectomy bed.

Lymph nodes: No radiotracer activity within pelvic lymph nodes.

Mild activity associated with a rounded LEFT inguinal lymph node
measuring 9 mm on image 206. This node has a normal fatty hilum and
activity is relatively low with SUV max equal 2.5.

No enlarged or radiotracer avid periaortic retroperitoneal nodes.

Liver: No evidence of liver metastasis

Incidental CT finding: None

SKELETON

There is no radiotracer activity associated with the sclerotic
lesion in the RIGHT iliac bone measuring 11 mm image 193. No foci
radiotracer activity within the remainder of the skeleton.
IMPRESSION: 1. No evidence of prostate cancer skeletal metastasis. Sclerotic
lesion in the RIGHT iliac bone does not have radiotracer activity
and is favored benign enostosis.
2. No evidence of pelvic nodal metastasis recurrence in the
prostatectomy bed.
3. Mild activity associated with LEFT inguinal lymph node is favored
benign physiologic.
4. Mild activity associated with subcarinal lymph node and LEFT
axillary lymph nodes are also favored benign physiologic activity.

## 2022-07-31 DIAGNOSIS — K5900 Constipation, unspecified: Secondary | ICD-10-CM | POA: Diagnosis not present

## 2022-07-31 DIAGNOSIS — C61 Malignant neoplasm of prostate: Secondary | ICD-10-CM | POA: Diagnosis not present

## 2022-07-31 DIAGNOSIS — Z79899 Other long term (current) drug therapy: Secondary | ICD-10-CM | POA: Diagnosis not present

## 2022-08-01 DIAGNOSIS — I700 Atherosclerosis of aorta: Secondary | ICD-10-CM | POA: Diagnosis not present

## 2022-08-01 DIAGNOSIS — N201 Calculus of ureter: Secondary | ICD-10-CM | POA: Diagnosis not present

## 2022-08-01 DIAGNOSIS — R109 Unspecified abdominal pain: Secondary | ICD-10-CM | POA: Diagnosis not present

## 2022-08-01 DIAGNOSIS — N200 Calculus of kidney: Secondary | ICD-10-CM | POA: Diagnosis not present

## 2022-08-01 DIAGNOSIS — N134 Hydroureter: Secondary | ICD-10-CM | POA: Diagnosis not present

## 2022-08-30 DIAGNOSIS — E7800 Pure hypercholesterolemia, unspecified: Secondary | ICD-10-CM | POA: Diagnosis not present

## 2022-08-30 DIAGNOSIS — I10 Essential (primary) hypertension: Secondary | ICD-10-CM | POA: Diagnosis not present

## 2022-08-30 DIAGNOSIS — Z79899 Other long term (current) drug therapy: Secondary | ICD-10-CM | POA: Diagnosis not present

## 2022-08-30 DIAGNOSIS — I679 Cerebrovascular disease, unspecified: Secondary | ICD-10-CM | POA: Diagnosis not present

## 2022-08-30 DIAGNOSIS — Z23 Encounter for immunization: Secondary | ICD-10-CM | POA: Diagnosis not present

## 2022-08-30 DIAGNOSIS — E11319 Type 2 diabetes mellitus with unspecified diabetic retinopathy without macular edema: Secondary | ICD-10-CM | POA: Diagnosis not present

## 2022-08-30 DIAGNOSIS — N201 Calculus of ureter: Secondary | ICD-10-CM | POA: Diagnosis not present

## 2022-08-30 DIAGNOSIS — I959 Hypotension, unspecified: Secondary | ICD-10-CM | POA: Diagnosis not present

## 2022-09-04 DIAGNOSIS — K5900 Constipation, unspecified: Secondary | ICD-10-CM | POA: Diagnosis not present

## 2022-09-28 DIAGNOSIS — E11319 Type 2 diabetes mellitus with unspecified diabetic retinopathy without macular edema: Secondary | ICD-10-CM | POA: Diagnosis not present

## 2022-09-28 DIAGNOSIS — I10 Essential (primary) hypertension: Secondary | ICD-10-CM | POA: Diagnosis not present

## 2022-09-28 DIAGNOSIS — I679 Cerebrovascular disease, unspecified: Secondary | ICD-10-CM | POA: Diagnosis not present

## 2022-09-28 DIAGNOSIS — E7800 Pure hypercholesterolemia, unspecified: Secondary | ICD-10-CM | POA: Diagnosis not present

## 2022-09-28 DIAGNOSIS — N183 Chronic kidney disease, stage 3 unspecified: Secondary | ICD-10-CM | POA: Diagnosis not present

## 2022-12-05 DIAGNOSIS — Z79899 Other long term (current) drug therapy: Secondary | ICD-10-CM | POA: Diagnosis not present

## 2022-12-05 DIAGNOSIS — K5900 Constipation, unspecified: Secondary | ICD-10-CM | POA: Diagnosis not present

## 2022-12-05 DIAGNOSIS — C61 Malignant neoplasm of prostate: Secondary | ICD-10-CM | POA: Diagnosis not present

## 2023-01-28 DIAGNOSIS — I679 Cerebrovascular disease, unspecified: Secondary | ICD-10-CM | POA: Diagnosis not present

## 2023-01-28 DIAGNOSIS — Z139 Encounter for screening, unspecified: Secondary | ICD-10-CM | POA: Diagnosis not present

## 2023-01-28 DIAGNOSIS — Z1331 Encounter for screening for depression: Secondary | ICD-10-CM | POA: Diagnosis not present

## 2023-01-28 DIAGNOSIS — E11319 Type 2 diabetes mellitus with unspecified diabetic retinopathy without macular edema: Secondary | ICD-10-CM | POA: Diagnosis not present

## 2023-01-28 DIAGNOSIS — Z85038 Personal history of other malignant neoplasm of large intestine: Secondary | ICD-10-CM | POA: Diagnosis not present

## 2023-01-28 DIAGNOSIS — I10 Essential (primary) hypertension: Secondary | ICD-10-CM | POA: Diagnosis not present

## 2023-01-28 DIAGNOSIS — N183 Chronic kidney disease, stage 3 unspecified: Secondary | ICD-10-CM | POA: Diagnosis not present

## 2023-01-28 DIAGNOSIS — E7800 Pure hypercholesterolemia, unspecified: Secondary | ICD-10-CM | POA: Diagnosis not present

## 2023-01-28 DIAGNOSIS — Z9181 History of falling: Secondary | ICD-10-CM | POA: Diagnosis not present

## 2023-04-12 DIAGNOSIS — C61 Malignant neoplasm of prostate: Secondary | ICD-10-CM | POA: Diagnosis not present

## 2023-04-12 DIAGNOSIS — Z79899 Other long term (current) drug therapy: Secondary | ICD-10-CM | POA: Diagnosis not present

## 2023-04-15 DIAGNOSIS — E11319 Type 2 diabetes mellitus with unspecified diabetic retinopathy without macular edema: Secondary | ICD-10-CM | POA: Diagnosis not present

## 2023-04-15 DIAGNOSIS — E7800 Pure hypercholesterolemia, unspecified: Secondary | ICD-10-CM | POA: Diagnosis not present

## 2023-04-15 DIAGNOSIS — N183 Chronic kidney disease, stage 3 unspecified: Secondary | ICD-10-CM | POA: Diagnosis not present

## 2023-04-15 DIAGNOSIS — I10 Essential (primary) hypertension: Secondary | ICD-10-CM | POA: Diagnosis not present

## 2023-04-15 DIAGNOSIS — I679 Cerebrovascular disease, unspecified: Secondary | ICD-10-CM | POA: Diagnosis not present

## 2023-04-15 DIAGNOSIS — Z85038 Personal history of other malignant neoplasm of large intestine: Secondary | ICD-10-CM | POA: Diagnosis not present

## 2023-04-15 DIAGNOSIS — Z0000 Encounter for general adult medical examination without abnormal findings: Secondary | ICD-10-CM | POA: Diagnosis not present

## 2023-04-15 DIAGNOSIS — C61 Malignant neoplasm of prostate: Secondary | ICD-10-CM | POA: Diagnosis not present

## 2023-08-19 DIAGNOSIS — Z79899 Other long term (current) drug therapy: Secondary | ICD-10-CM | POA: Diagnosis not present

## 2023-08-19 DIAGNOSIS — C61 Malignant neoplasm of prostate: Secondary | ICD-10-CM | POA: Diagnosis not present

## 2023-08-20 DIAGNOSIS — Z139 Encounter for screening, unspecified: Secondary | ICD-10-CM | POA: Diagnosis not present

## 2023-08-20 DIAGNOSIS — Z1331 Encounter for screening for depression: Secondary | ICD-10-CM | POA: Diagnosis not present

## 2023-08-20 DIAGNOSIS — Z9181 History of falling: Secondary | ICD-10-CM | POA: Diagnosis not present

## 2023-08-20 DIAGNOSIS — Z0000 Encounter for general adult medical examination without abnormal findings: Secondary | ICD-10-CM | POA: Diagnosis not present

## 2023-09-05 DIAGNOSIS — E11319 Type 2 diabetes mellitus with unspecified diabetic retinopathy without macular edema: Secondary | ICD-10-CM | POA: Diagnosis not present

## 2023-09-05 DIAGNOSIS — N183 Chronic kidney disease, stage 3 unspecified: Secondary | ICD-10-CM | POA: Diagnosis not present

## 2023-09-05 DIAGNOSIS — I679 Cerebrovascular disease, unspecified: Secondary | ICD-10-CM | POA: Diagnosis not present

## 2023-09-05 DIAGNOSIS — E7800 Pure hypercholesterolemia, unspecified: Secondary | ICD-10-CM | POA: Diagnosis not present

## 2023-09-05 DIAGNOSIS — Z23 Encounter for immunization: Secondary | ICD-10-CM | POA: Diagnosis not present

## 2023-09-05 DIAGNOSIS — Z85038 Personal history of other malignant neoplasm of large intestine: Secondary | ICD-10-CM | POA: Diagnosis not present

## 2023-09-05 DIAGNOSIS — I10 Essential (primary) hypertension: Secondary | ICD-10-CM | POA: Diagnosis not present

## 2023-12-11 DIAGNOSIS — E11319 Type 2 diabetes mellitus with unspecified diabetic retinopathy without macular edema: Secondary | ICD-10-CM | POA: Diagnosis not present

## 2023-12-11 DIAGNOSIS — E7800 Pure hypercholesterolemia, unspecified: Secondary | ICD-10-CM | POA: Diagnosis not present

## 2023-12-11 DIAGNOSIS — I10 Essential (primary) hypertension: Secondary | ICD-10-CM | POA: Diagnosis not present

## 2023-12-11 DIAGNOSIS — I679 Cerebrovascular disease, unspecified: Secondary | ICD-10-CM | POA: Diagnosis not present

## 2023-12-11 DIAGNOSIS — N183 Chronic kidney disease, stage 3 unspecified: Secondary | ICD-10-CM | POA: Diagnosis not present

## 2023-12-24 DIAGNOSIS — Z79899 Other long term (current) drug therapy: Secondary | ICD-10-CM | POA: Diagnosis not present

## 2023-12-24 DIAGNOSIS — C61 Malignant neoplasm of prostate: Secondary | ICD-10-CM | POA: Diagnosis not present

## 2024-03-20 DIAGNOSIS — I10 Essential (primary) hypertension: Secondary | ICD-10-CM | POA: Diagnosis not present

## 2024-03-20 DIAGNOSIS — E7800 Pure hypercholesterolemia, unspecified: Secondary | ICD-10-CM | POA: Diagnosis not present

## 2024-03-20 DIAGNOSIS — E11319 Type 2 diabetes mellitus with unspecified diabetic retinopathy without macular edema: Secondary | ICD-10-CM | POA: Diagnosis not present

## 2024-03-20 DIAGNOSIS — I679 Cerebrovascular disease, unspecified: Secondary | ICD-10-CM | POA: Diagnosis not present

## 2024-03-20 DIAGNOSIS — N183 Chronic kidney disease, stage 3 unspecified: Secondary | ICD-10-CM | POA: Diagnosis not present

## 2024-04-21 DIAGNOSIS — C61 Malignant neoplasm of prostate: Secondary | ICD-10-CM | POA: Diagnosis not present

## 2024-04-21 DIAGNOSIS — K5900 Constipation, unspecified: Secondary | ICD-10-CM | POA: Diagnosis not present

## 2024-04-21 DIAGNOSIS — Z79899 Other long term (current) drug therapy: Secondary | ICD-10-CM | POA: Diagnosis not present

## 2024-07-02 DIAGNOSIS — E7800 Pure hypercholesterolemia, unspecified: Secondary | ICD-10-CM | POA: Diagnosis not present

## 2024-07-02 DIAGNOSIS — I10 Essential (primary) hypertension: Secondary | ICD-10-CM | POA: Diagnosis not present

## 2024-07-02 DIAGNOSIS — I679 Cerebrovascular disease, unspecified: Secondary | ICD-10-CM | POA: Diagnosis not present

## 2024-07-02 DIAGNOSIS — N183 Chronic kidney disease, stage 3 unspecified: Secondary | ICD-10-CM | POA: Diagnosis not present

## 2024-07-02 DIAGNOSIS — E11319 Type 2 diabetes mellitus with unspecified diabetic retinopathy without macular edema: Secondary | ICD-10-CM | POA: Diagnosis not present

## 2024-08-13 DIAGNOSIS — E119 Type 2 diabetes mellitus without complications: Secondary | ICD-10-CM | POA: Diagnosis not present

## 2024-08-13 DIAGNOSIS — I10 Essential (primary) hypertension: Secondary | ICD-10-CM | POA: Diagnosis not present

## 2024-08-13 DIAGNOSIS — R110 Nausea: Secondary | ICD-10-CM | POA: Diagnosis not present

## 2024-08-13 DIAGNOSIS — M549 Dorsalgia, unspecified: Secondary | ICD-10-CM | POA: Diagnosis not present

## 2024-08-13 DIAGNOSIS — Z7982 Long term (current) use of aspirin: Secondary | ICD-10-CM | POA: Diagnosis not present

## 2024-08-13 DIAGNOSIS — Z85030 Personal history of malignant carcinoid tumor of large intestine: Secondary | ICD-10-CM | POA: Diagnosis not present

## 2024-08-13 DIAGNOSIS — Z79899 Other long term (current) drug therapy: Secondary | ICD-10-CM | POA: Diagnosis not present

## 2024-08-13 DIAGNOSIS — R1010 Upper abdominal pain, unspecified: Secondary | ICD-10-CM | POA: Diagnosis not present

## 2024-08-13 DIAGNOSIS — R112 Nausea with vomiting, unspecified: Secondary | ICD-10-CM | POA: Diagnosis not present

## 2024-08-13 DIAGNOSIS — R1013 Epigastric pain: Secondary | ICD-10-CM | POA: Diagnosis not present

## 2024-08-13 DIAGNOSIS — M546 Pain in thoracic spine: Secondary | ICD-10-CM | POA: Diagnosis not present

## 2024-08-13 DIAGNOSIS — K2900 Acute gastritis without bleeding: Secondary | ICD-10-CM | POA: Diagnosis not present

## 2024-08-13 DIAGNOSIS — R509 Fever, unspecified: Secondary | ICD-10-CM | POA: Diagnosis not present

## 2024-08-13 DIAGNOSIS — Z8546 Personal history of malignant neoplasm of prostate: Secondary | ICD-10-CM | POA: Diagnosis not present

## 2024-08-13 DIAGNOSIS — Z87442 Personal history of urinary calculi: Secondary | ICD-10-CM | POA: Diagnosis not present

## 2024-10-08 DIAGNOSIS — I10 Essential (primary) hypertension: Secondary | ICD-10-CM | POA: Diagnosis not present

## 2024-10-08 DIAGNOSIS — Z9181 History of falling: Secondary | ICD-10-CM | POA: Diagnosis not present

## 2024-10-08 DIAGNOSIS — N183 Chronic kidney disease, stage 3 unspecified: Secondary | ICD-10-CM | POA: Diagnosis not present

## 2024-10-08 DIAGNOSIS — I679 Cerebrovascular disease, unspecified: Secondary | ICD-10-CM | POA: Diagnosis not present

## 2024-10-08 DIAGNOSIS — E7800 Pure hypercholesterolemia, unspecified: Secondary | ICD-10-CM | POA: Diagnosis not present

## 2024-10-08 DIAGNOSIS — Z139 Encounter for screening, unspecified: Secondary | ICD-10-CM | POA: Diagnosis not present

## 2024-10-08 DIAGNOSIS — Z23 Encounter for immunization: Secondary | ICD-10-CM | POA: Diagnosis not present

## 2024-10-08 DIAGNOSIS — Z1331 Encounter for screening for depression: Secondary | ICD-10-CM | POA: Diagnosis not present

## 2024-10-08 DIAGNOSIS — E11319 Type 2 diabetes mellitus with unspecified diabetic retinopathy without macular edema: Secondary | ICD-10-CM | POA: Diagnosis not present
